# Patient Record
Sex: Female | Born: 2000 | Race: Black or African American | Hispanic: No | Marital: Single | Smoking: Never smoker
Health system: Southern US, Community
[De-identification: ages and names within clinical notes are randomized; demographics above are authoritative.]

## PROBLEM LIST (undated history)

## (undated) DIAGNOSIS — J45998 Other asthma: Secondary | ICD-10-CM

## (undated) DIAGNOSIS — J302 Other seasonal allergic rhinitis: Secondary | ICD-10-CM

## (undated) HISTORY — DX: Other asthma: J45.998

## (undated) HISTORY — DX: Other seasonal allergic rhinitis: J30.2

## (undated) HISTORY — PX: OTHER SURGICAL HISTORY: SHX169

---
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
## 2018-01-11 DIAGNOSIS — J4541 Moderate persistent asthma with (acute) exacerbation: Secondary | ICD-10-CM

## 2018-01-11 NOTE — ED Notes (Signed)
Pt getting breathing tx.

## 2018-01-11 NOTE — ED Triage Notes (Signed)
Pt states she has been feeling sob x 2 days and has been doing her treatments and is not getting pt having exp wheezing

## 2018-01-11 NOTE — ED Provider Notes (Addendum)
17 year old female with asthma presents with a 2 day exacerbation.  Yesterday she used her pro-air inhaler 2 puffs at a time 3-4 times without much improvement.  Today she used her nebulizer 4 times, with albuterol and added ipratropium to one treatment.    sHe still feels tight, has a mild dry cough which is nonproductive.  Patient denies fevers.    She has been on steroids before, but not recently.             Past Medical History:   Diagnosis Date   ??? Allergic rhinitis    ??? Aspergillosis (HCC)    ??? Asthma    ??? Asthma    ??? GERD (gastroesophageal reflux disease)    ??? Molluscum contagiosum        No past surgical history on file.      Family History:   Problem Relation Age of Onset   ??? Hypertension Other    ??? Diabetes Other    ??? Coronary Artery Disease Other    ??? Other Other         CEREBROVASCULAR ACCIDENT       Social History     Socioeconomic History   ??? Marital status: SINGLE     Spouse name: Not on file   ??? Number of children: Not on file   ??? Years of education: Not on file   ??? Highest education level: Not on file   Occupational History   ??? Not on file   Social Needs   ??? Financial resource strain: Not on file   ??? Food insecurity:     Worry: Not on file     Inability: Not on file   ??? Transportation needs:     Medical: Not on file     Non-medical: Not on file   Tobacco Use   ??? Smoking status: Never Smoker   Substance and Sexual Activity   ??? Alcohol use: No   ??? Drug use: No   ??? Sexual activity: Never   Lifestyle   ??? Physical activity:     Days per week: Not on file     Minutes per session: Not on file   ??? Stress: Not on file   Relationships   ??? Social connections:     Talks on phone: Not on file     Gets together: Not on file     Attends religious service: Not on file     Active member of club or organization: Not on file     Attends meetings of clubs or organizations: Not on file     Relationship status: Not on file   ??? Intimate partner violence:     Fear of current or ex partner: Not on file      Emotionally abused: Not on file     Physically abused: Not on file     Forced sexual activity: Not on file   Other Topics Concern   ??? Not on file   Social History Narrative   ??? Not on file         ALLERGIES: Fish containing products; Peanut oil; and Singulair [montelukast]    Review of Systems   Constitutional: Negative for chills and fever.   HENT: Negative for rhinorrhea and sore throat.    Eyes: Negative for discharge and redness.   Respiratory: Positive for cough, shortness of breath and wheezing. Negative for stridor.    Cardiovascular: Negative for chest pain and palpitations.   Gastrointestinal: Negative  for abdominal pain, nausea and vomiting.   Musculoskeletal: Negative for arthralgias and back pain.   Skin: Negative for rash.   Neurological: Negative for dizziness and headaches.   All other systems reviewed and are negative.      Vitals:    01/11/18 2242 01/11/18 2323   BP: 108/73    Pulse: 72    Resp: 20    Temp: 98.3 ??F (36.8 ??C)    SpO2: 100% 100%   Weight: 81.6 kg (180 lb)    Height: 5\' 7"  (1.702 m)             Physical Exam   Constitutional: She is oriented to person, place, and time. She appears well-developed and well-nourished. No distress.   HENT:   Head: Normocephalic and atraumatic.   Mouth/Throat: Oropharynx is clear and moist.   Eyes: Pupils are equal, round, and reactive to light. Conjunctivae and EOM are normal. Right eye exhibits no discharge. Left eye exhibits no discharge. No scleral icterus.   Neck: Normal range of motion. Neck supple.   Cardiovascular: Normal rate, regular rhythm and normal heart sounds. Exam reveals no gallop.   No murmur heard.  Pulmonary/Chest: Effort normal. No stridor. No respiratory distress. She has wheezes. She has no rales.   Musculoskeletal: Normal range of motion. She exhibits no edema.   Neurological: She is alert and oriented to person, place, and time. She exhibits normal muscle tone.   cni 2-12 grossly    Skin: Skin is warm and dry. She is not diaphoretic.   Psychiatric: She has a normal mood and affect. Her behavior is normal.   Nursing note and vitals reviewed.       MDM  Number of Diagnoses or Management Options  Moderate persistent asthma with acute exacerbation:   Diagnosis management comments: Medical decision making note:  Asthma attack, persistent  Give some IV magnesium and IV steroids.  Reassess after a 10 mg albuterol nebulizer.  If suitable for discharge we'll send home with some oral steroids for the next few days    12:13 AM  Feeling much better,, wheezing gone, sats remain 100%    This concludes the "medical decision making note" part of this emergency department visit note.           Procedures

## 2018-01-11 NOTE — ED Provider Notes (Signed)
17 year old female with asthma presents with a 2 day exacerbation.  Yesterday she used her pro-air inhaler 2 puffs at a time 3-4 times without much improvement.  Today she used her nebulizer 4 times, with albuterol and added ipratropium to one treatment.    sHe still feels tight, has a mild dry cough which is nonproductive.  Patient denies fevers.    She has been on steroids before, but not recently.             Past Medical History:   Diagnosis Date   ??? Allergic rhinitis    ??? Aspergillosis (HCC)    ??? Asthma    ??? Asthma    ??? GERD (gastroesophageal reflux disease)    ??? Molluscum contagiosum        No past surgical history on file.      Family History:   Problem Relation Age of Onset   ??? Hypertension Other    ??? Diabetes Other    ??? Coronary Artery Disease Other    ??? Other Other         CEREBROVASCULAR ACCIDENT       Social History     Socioeconomic History   ??? Marital status: SINGLE     Spouse name: Not on file   ??? Number of children: Not on file   ??? Years of education: Not on file   ??? Highest education level: Not on file   Occupational History   ??? Not on file   Social Needs   ??? Financial resource strain: Not on file   ??? Food insecurity:     Worry: Not on file     Inability: Not on file   ??? Transportation needs:     Medical: Not on file     Non-medical: Not on file   Tobacco Use   ??? Smoking status: Never Smoker   Substance and Sexual Activity   ??? Alcohol use: No   ??? Drug use: No   ??? Sexual activity: Never   Lifestyle   ??? Physical activity:     Days per week: Not on file     Minutes per session: Not on file   ??? Stress: Not on file   Relationships   ??? Social connections:     Talks on phone: Not on file     Gets together: Not on file     Attends religious service: Not on file     Active member of club or organization: Not on file     Attends meetings of clubs or organizations: Not on file     Relationship status: Not on file   ??? Intimate partner violence:     Fear of current or ex partner: Not on file     Emotionally  abused: Not on file     Physically abused: Not on file     Forced sexual activity: Not on file   Other Topics Concern   ??? Not on file   Social History Narrative   ??? Not on file         ALLERGIES: Fish containing products; Peanut oil; and Singulair [montelukast]    Review of Systems   Constitutional: Negative for chills and fever.   HENT: Negative for rhinorrhea and sore throat.    Eyes: Negative for discharge and redness.   Respiratory: Positive for cough, shortness of breath and wheezing. Negative for stridor.    Cardiovascular: Negative for chest pain and palpitations.   Gastrointestinal: Negative  for abdominal pain, nausea and vomiting.   Musculoskeletal: Negative for arthralgias and back pain.   Skin: Negative for rash.   Neurological: Negative for dizziness and headaches.   All other systems reviewed and are negative.      Vitals:    01/11/18 2242 01/11/18 2323   BP: 108/73    Pulse: 72    Resp: 20    Temp: 98.3 ??F (36.8 ??C)    SpO2: 100% 100%   Weight: 81.6 kg (180 lb)    Height: 5\' 7"  (1.702 m)             Physical Exam   Constitutional: She is oriented to person, place, and time. She appears well-developed and well-nourished. No distress.   HENT:   Head: Normocephalic and atraumatic.   Mouth/Throat: Oropharynx is clear and moist.   Eyes: Pupils are equal, round, and reactive to light. Conjunctivae and EOM are normal. Right eye exhibits no discharge. Left eye exhibits no discharge. No scleral icterus.   Neck: Normal range of motion. Neck supple.   Cardiovascular: Normal rate, regular rhythm and normal heart sounds. Exam reveals no gallop.   No murmur heard.  Pulmonary/Chest: Effort normal. No stridor. No respiratory distress. She has wheezes. She has no rales.   Musculoskeletal: Normal range of motion. She exhibits no edema.   Neurological: She is alert and oriented to person, place, and time. She exhibits normal muscle tone.   cni 2-12 grossly   Skin: Skin is warm and dry. She is not diaphoretic.    Psychiatric: She has a normal mood and affect. Her behavior is normal.   Nursing note and vitals reviewed.       MDM  Number of Diagnoses or Management Options  Moderate persistent asthma with acute exacerbation:   Diagnosis management comments: Medical decision making note:  Asthma attack, persistent  Give some IV magnesium and IV steroids.  Reassess after a 10 mg albuterol nebulizer.  If suitable for discharge we'll send home with some oral steroids for the next few days    12:13 AM  Feeling much better,, wheezing gone, sats remain 100%    This concludes the "medical decision making note" part of this emergency department visit note.           Procedures

## 2018-01-11 NOTE — ED Notes (Signed)
Pt states she has been feeling sob x 2 days and has been doing her treatments and is not getting pt having exp wheezing

## 2018-01-12 ENCOUNTER — Inpatient Hospital Stay
Admit: 2018-01-12 | Discharge: 2018-01-12 | Disposition: A | Discharge status: Home / Self Care | Payer: Self-pay | Attending: Emergency Medicine

## 2018-01-12 MED ORDER — MAGNESIUM SULFATE 2 GRAM/50 ML IVPB
2 gram/50 mL (4 %) | INTRAVENOUS | Status: AC
Start: 2018-01-12 — End: 2018-01-12
  Administered 2018-01-12: 03:00:00 via INTRAVENOUS

## 2018-01-12 MED ORDER — SODIUM CHLORIDE 0.9% BOLUS IV
0.9 % | Freq: Once | INTRAVENOUS | Status: AC
Start: 2018-01-12 — End: 2018-01-12
  Administered 2018-01-12: 03:00:00 via INTRAVENOUS

## 2018-01-12 MED ORDER — METHYLPREDNISOLONE (PF) 125 MG/2 ML IJ SOLR
125 mg/2 mL | Freq: Once | INTRAMUSCULAR | Status: AC
Start: 2018-01-12 — End: 2018-01-11
  Administered 2018-01-12: 03:00:00 via INTRAVENOUS

## 2018-01-12 MED ORDER — PREDNISONE 20 MG TAB
20 mg | ORAL_TABLET | Freq: Every day | ORAL | 0 refills | Status: AC
Start: 2018-01-12 — End: 2018-01-15

## 2018-01-12 MED ORDER — ALBUTEROL SULFATE 0.083 % (0.83 MG/ML) SOLN FOR INHALATION
2.5 mg /3 mL (0.083 %) | RESPIRATORY_TRACT | Status: AC
Start: 2018-01-12 — End: 2018-01-11
  Administered 2018-01-12: 03:00:00 via RESPIRATORY_TRACT

## 2018-01-12 MED FILL — SOLU-MEDROL (PF) 125 MG/2 ML SOLUTION FOR INJECTION: 125 mg/2 mL | INTRAMUSCULAR | Qty: 2

## 2018-01-12 MED FILL — MAGNESIUM SULFATE 2 GRAM/50 ML IVPB: 2 gram/50 mL (4 %) | INTRAVENOUS | Qty: 50

## 2018-01-12 MED FILL — ALBUTEROL SULFATE 0.083 % (0.83 MG/ML) SOLN FOR INHALATION: 2.5 mg /3 mL (0.083 %) | RESPIRATORY_TRACT | Qty: 4

## 2018-01-12 NOTE — ED Notes (Signed)
20 ga IV removed from  Right ac at time of discharge

## 2018-01-12 NOTE — ED Notes (Signed)
I have reviewed discharge instructions with the patient.  The patient verbalized understanding.    Patient left ED via Discharge Method: ambulatory to Home with self.    Opportunity for questions and clarification provided.       Patient given 1 scripts.         To continue your aftercare when you leave the hospital, you may receive an automated call from our care team to check in on how you are doing.  This is a free service and part of our promise to provide the best care and service to meet your aftercare needs.??? If you have questions, or wish to unsubscribe from this service please call [phone].  Thank you for Choosing our [place] [facility] Department.

## 2018-01-12 NOTE — ED Notes (Signed)
20 ga IV removed from  Right ac at time of discharge

## 2018-01-12 NOTE — ED Notes (Signed)
I have reviewed discharge instructions with the patient.  The patient verbalized understanding.    Patient left ED via Discharge Method: ambulatory to Home with self.    Opportunity for questions and clarification provided.       Patient given 1 scripts.         To continue your aftercare when you leave the hospital, you may receive an automated call from our care team to check in on how you are doing.  This is a free service and part of our promise to provide the best care and service to meet your aftercare needs." If you have questions, or wish to unsubscribe from this service please call [phone].  Thank you for Choosing our [place] Emergency Department.

## 2018-02-07 ENCOUNTER — Inpatient Hospital Stay
Admit: 2018-02-07 | Discharge: 2018-02-07 | Disposition: A | Discharge status: Home / Self Care | Payer: Self-pay | Attending: Emergency Medicine

## 2018-02-07 DIAGNOSIS — T7849XA Other allergy, initial encounter: Secondary | ICD-10-CM

## 2018-02-07 MED ORDER — HYDROXYZINE PAMOATE 25 MG CAP
25 mg | ORAL | Status: AC
Start: 2018-02-07 — End: 2018-02-07
  Administered 2018-02-07: 18:00:00 via ORAL

## 2018-02-07 MED ORDER — HYDROXYZINE PAMOATE 25 MG CAP
25 mg | ORAL_CAPSULE | Freq: Four times a day (QID) | ORAL | 0 refills | Status: AC | PRN
Start: 2018-02-07 — End: 2018-02-14

## 2018-02-07 MED ORDER — FAMOTIDINE 20 MG TAB
20 mg | ORAL | Status: AC
Start: 2018-02-07 — End: 2018-02-07
  Administered 2018-02-07: 18:00:00 via ORAL

## 2018-02-07 MED ORDER — FAMOTIDINE 20 MG TAB
20 mg | ORAL_TABLET | Freq: Two times a day (BID) | ORAL | 0 refills | Status: AC
Start: 2018-02-07 — End: 2018-02-17

## 2018-02-07 MED ORDER — PREDNISONE 20 MG TAB
20 mg | ORAL_TABLET | Freq: Every day | ORAL | 0 refills | Status: AC
Start: 2018-02-07 — End: 2018-02-12

## 2018-02-07 MED ORDER — PREDNISONE 10 MG TAB
10 mg | ORAL | Status: AC
Start: 2018-02-07 — End: 2018-02-07
  Administered 2018-02-07: 18:00:00 via ORAL

## 2018-02-07 MED FILL — FAMOTIDINE 20 MG TAB: 20 mg | ORAL | Qty: 2

## 2018-02-07 MED FILL — PREDNISONE 10 MG TAB: 10 mg | ORAL | Qty: 1

## 2018-02-07 MED FILL — HYDROXYZINE PAMOATE 25 MG CAP: 25 mg | ORAL | Qty: 1

## 2018-02-07 NOTE — ED Triage Notes (Signed)
Pt has a rash around lips for a few days that is itching. States she has had this before when changing lip balms. States she has been tested for herpes and that was negative.

## 2018-02-07 NOTE — ED Notes (Signed)
I have reviewed discharge instructions with the patient and parent.  The patient and parent verbalized understanding.    Patient left ED via Discharge Method: wheelchair to Home with mother.    Opportunity for questions and clarification provided.       Patient given 3 scripts. Prednisone, vistaril and pepcid. Instructed to start steroids tomorrow at lunch. Work excuse given for mother and patient as requested.        To continue your aftercare when you leave the hospital, you may receive an automated call from our care team to check in on how you are doing.  This is a free service and part of our promise to provide the best care and service to meet your aftercare needs.??? If you have questions, or wish to unsubscribe from this service please call [phone].  Thank you for Choosing our [facility]  Emergency Department.

## 2018-02-07 NOTE — Progress Notes (Signed)
Visited with patient as no PCP listed in chart.  Patient states no PCP and no insurance.  Explained the [employer] program in detail and provided patient with resources. Contact information for Sherry with [employer] given to patient and patient encouraged to follow up with her as soon as possible. Patient also given contact information for Jeremy with [employer] and encouraged to call to get screened for Medicaid/Insurance eligibility and/or financial assistance.     Tina LeAnn Chastain, BSW  Social Work Case Manager  [facility]  [email or web address]

## 2018-02-07 NOTE — ED Provider Notes (Signed)
17 year old female with a past medical history significant for asthma  Complains of itching rash on her lower face and perioral area    Patient's only new exposure is a change in her lip gloss  Similar reaction in the past when she used some cinnamon flavored lip gloss    The history is provided by the patient and a parent.   Rash    This is a new problem. The current episode started yesterday. The problem has been gradually worsening. The problem is associated with new makeup. There has been no fever. The rash is present on the face and lips. The pain is mild. The pain has been constant since onset. Associated symptoms include itching. Pertinent negatives include no blisters. She has tried nothing for the symptoms. The treatment provided no relief.        Past Medical History:   Diagnosis Date   ??? Allergic rhinitis    ??? Aspergillosis (HCC)    ??? Asthma    ??? Asthma    ??? GERD (gastroesophageal reflux disease)    ??? Molluscum contagiosum        History reviewed. No pertinent surgical history.      Family History:   Problem Relation Age of Onset   ??? Hypertension Other    ??? Diabetes Other    ??? Coronary Artery Disease Other    ??? Other Other         CEREBROVASCULAR ACCIDENT       Social History     Socioeconomic History   ??? Marital status: SINGLE     Spouse name: Not on file   ??? Number of children: Not on file   ??? Years of education: Not on file   ??? Highest education level: Not on file   Occupational History   ??? Not on file   Social Needs   ??? Financial resource strain: Not on file   ??? Food insecurity:     Worry: Not on file     Inability: Not on file   ??? Transportation needs:     Medical: Not on file     Non-medical: Not on file   Tobacco Use   ??? Smoking status: Never Smoker   Substance and Sexual Activity   ??? Alcohol use: No   ??? Drug use: No   ??? Sexual activity: Never   Lifestyle   ??? Physical activity:     Days per week: Not on file     Minutes per session: Not on file   ??? Stress: Not on file   Relationships    ??? Social connections:     Talks on phone: Not on file     Gets together: Not on file     Attends religious service: Not on file     Active member of club or organization: Not on file     Attends meetings of clubs or organizations: Not on file     Relationship status: Not on file   ??? Intimate partner violence:     Fear of current or ex partner: Not on file     Emotionally abused: Not on file     Physically abused: Not on file     Forced sexual activity: Not on file   Other Topics Concern   ??? Not on file   Social History Narrative   ??? Not on file         ALLERGIES: Fish containing products; Peanut oil; and Singulair [montelukast]  Review of Systems   Constitutional: Negative for chills and fever.   HENT: Negative for congestion, ear pain and rhinorrhea.    Eyes: Negative for photophobia and discharge.   Respiratory: Negative for cough and shortness of breath.    Cardiovascular: Negative for chest pain and palpitations.   Gastrointestinal: Negative for abdominal pain, constipation, diarrhea and vomiting.   Endocrine: Negative for cold intolerance and heat intolerance.   Genitourinary: Negative for dysuria and flank pain.   Musculoskeletal: Negative for arthralgias, myalgias and neck pain.   Skin: Positive for itching and rash. Negative for wound.   Allergic/Immunologic: Negative for environmental allergies and food allergies.   Neurological: Negative for syncope and headaches.   Hematological: Negative for adenopathy. Does not bruise/bleed easily.   Psychiatric/Behavioral: Negative for dysphoric mood. The patient is not nervous/anxious.    All other systems reviewed and are negative.      Vitals:    02/07/18 1326   BP: 116/76   Pulse: 94   Resp: 20   Temp: 99 ??F (37.2 ??C)   SpO2: 100%   Weight: 95.3 kg (210 lb)   Height: 5\' 8"  (1.727 m)            Physical Exam   Constitutional: She is oriented to person, place, and time. She appears well-developed and well-nourished. She appears distressed.   HENT:    Head: Normocephalic and atraumatic.   Mouth/Throat: Oropharynx is clear and moist. No oropharyngeal exudate.   Eyes: Pupils are equal, round, and reactive to light. Conjunctivae and EOM are normal.   Neck: Normal range of motion. Neck supple. No JVD present.   Cardiovascular: Normal rate, regular rhythm, normal heart sounds and intact distal pulses. Exam reveals no gallop and no friction rub.   No murmur heard.  Pulmonary/Chest: Effort normal and breath sounds normal.   Abdominal: Soft. Normal appearance and bowel sounds are normal. She exhibits no distension and no mass. There is no hepatosplenomegaly. There is no tenderness.   Musculoskeletal: Normal range of motion. She exhibits no edema or deformity.   Neurological: She is alert and oriented to person, place, and time. She has normal strength. No cranial nerve deficit or sensory deficit. She displays a negative Romberg sign. Gait normal.   Skin: Skin is warm and dry. Capillary refill takes less than 2 seconds. Rash noted.   Papular rash noted in the perioral region as well as extending along both jaw lines into the cheeks  No open wounds  No pustules  No swelling or erythema noted   Psychiatric: She has a normal mood and affect. Her speech is normal and behavior is normal. Judgment and thought content normal. Cognition and memory are normal.   Nursing note and vitals reviewed.       MDM  Number of Diagnoses or Management Options  Allergic reaction, initial encounter: new and does not require workup  Diagnosis management comments: Symptoms appear related to a allergic reaction.  We will treat symptomatically with steroids, histamine blockade           Amount and/or Complexity of Data Reviewed  Tests in the medicine section of CPT??: ordered and reviewed  Review and summarize past medical records: yes    Risk of Complications, Morbidity, and/or Mortality  Presenting problems: moderate  Diagnostic procedures: minimal  Management options: low   General comments: Elements of this note have been dictated via voice recognition software.  Text and phrases may be limited by the accuracy of the software.  The chart has been reviewed, but errors may still be present.      Patient Progress  Patient progress: stable         Procedures

## 2018-02-07 NOTE — ED Notes (Signed)
 I have reviewed discharge instructions with the patient and parent.  The patient and parent verbalized understanding.    Patient left ED via Discharge Method: wheelchair to Home with mother.    Opportunity for questions and clarification provided.       Patient given 3 scripts. Prednisone , vistaril and pepcid. Instructed to start steroids tomorrow at lunch. Work excuse given for mother and patient as requested.        To continue your aftercare when you leave the hospital, you may receive an automated call from our care team to check in on how you are doing.  This is a free service and part of our promise to provide the best care and service to meet your aftercare needs." If you have questions, or wish to unsubscribe from this service please call [phone].  Thank you for Choosing our [facility] Emergency Department.

## 2018-02-07 NOTE — ED Provider Notes (Signed)
17 year old female with a past medical history significant for asthma  Complains of itching rash on her lower face and perioral area    Patient's only new exposure is a change in her lip gloss  Similar reaction in the past when she used some cinnamon flavored lip gloss    The history is provided by the patient and a parent.   Rash    This is a new problem. The current episode started yesterday. The problem has been gradually worsening. The problem is associated with new makeup. There has been no fever. The rash is present on the face and lips. The pain is mild. The pain has been constant since onset. Associated symptoms include itching. Pertinent negatives include no blisters. She has tried nothing for the symptoms. The treatment provided no relief.        Past Medical History:   Diagnosis Date   ??? Allergic rhinitis    ??? Aspergillosis (HCC)    ??? Asthma    ??? Asthma    ??? GERD (gastroesophageal reflux disease)    ??? Molluscum contagiosum        History reviewed. No pertinent surgical history.      Family History:   Problem Relation Age of Onset   ??? Hypertension Other    ??? Diabetes Other    ??? Coronary Artery Disease Other    ??? Other Other         CEREBROVASCULAR ACCIDENT       Social History     Socioeconomic History   ??? Marital status: SINGLE     Spouse name: Not on file   ??? Number of children: Not on file   ??? Years of education: Not on file   ??? Highest education level: Not on file   Occupational History   ??? Not on file   Social Needs   ??? Financial resource strain: Not on file   ??? Food insecurity:     Worry: Not on file     Inability: Not on file   ??? Transportation needs:     Medical: Not on file     Non-medical: Not on file   Tobacco Use   ??? Smoking status: Never Smoker   Substance and Sexual Activity   ??? Alcohol use: No   ??? Drug use: No   ??? Sexual activity: Never   Lifestyle   ??? Physical activity:     Days per week: Not on file     Minutes per session: Not on file   ??? Stress: Not on file   Relationships   ??? Social  connections:     Talks on phone: Not on file     Gets together: Not on file     Attends religious service: Not on file     Active member of club or organization: Not on file     Attends meetings of clubs or organizations: Not on file     Relationship status: Not on file   ??? Intimate partner violence:     Fear of current or ex partner: Not on file     Emotionally abused: Not on file     Physically abused: Not on file     Forced sexual activity: Not on file   Other Topics Concern   ??? Not on file   Social History Narrative   ??? Not on file         ALLERGIES: Fish containing products; Peanut oil; and Singulair [montelukast]  Review of Systems   Constitutional: Negative for chills and fever.   HENT: Negative for congestion, ear pain and rhinorrhea.    Eyes: Negative for photophobia and discharge.   Respiratory: Negative for cough and shortness of breath.    Cardiovascular: Negative for chest pain and palpitations.   Gastrointestinal: Negative for abdominal pain, constipation, diarrhea and vomiting.   Endocrine: Negative for cold intolerance and heat intolerance.   Genitourinary: Negative for dysuria and flank pain.   Musculoskeletal: Negative for arthralgias, myalgias and neck pain.   Skin: Positive for itching and rash. Negative for wound.   Allergic/Immunologic: Negative for environmental allergies and food allergies.   Neurological: Negative for syncope and headaches.   Hematological: Negative for adenopathy. Does not bruise/bleed easily.   Psychiatric/Behavioral: Negative for dysphoric mood. The patient is not nervous/anxious.    All other systems reviewed and are negative.      Vitals:    02/07/18 1326   BP: 116/76   Pulse: 94   Resp: 20   Temp: 99 ??F (37.2 ??C)   SpO2: 100%   Weight: 95.3 kg (210 lb)   Height: 5\' 8"  (1.727 m)            Physical Exam   Constitutional: She is oriented to person, place, and time. She appears well-developed and well-nourished. She appears distressed.   HENT:   Head: Normocephalic and  atraumatic.   Mouth/Throat: Oropharynx is clear and moist. No oropharyngeal exudate.   Eyes: Pupils are equal, round, and reactive to light. Conjunctivae and EOM are normal.   Neck: Normal range of motion. Neck supple. No JVD present.   Cardiovascular: Normal rate, regular rhythm, normal heart sounds and intact distal pulses. Exam reveals no gallop and no friction rub.   No murmur heard.  Pulmonary/Chest: Effort normal and breath sounds normal.   Abdominal: Soft. Normal appearance and bowel sounds are normal. She exhibits no distension and no mass. There is no hepatosplenomegaly. There is no tenderness.   Musculoskeletal: Normal range of motion. She exhibits no edema or deformity.   Neurological: She is alert and oriented to person, place, and time. She has normal strength. No cranial nerve deficit or sensory deficit. She displays a negative Romberg sign. Gait normal.   Skin: Skin is warm and dry. Capillary refill takes less than 2 seconds. Rash noted.   Papular rash noted in the perioral region as well as extending along both jaw lines into the cheeks  No open wounds  No pustules  No swelling or erythema noted   Psychiatric: She has a normal mood and affect. Her speech is normal and behavior is normal. Judgment and thought content normal. Cognition and memory are normal.   Nursing note and vitals reviewed.       MDM  Number of Diagnoses or Management Options  Allergic reaction, initial encounter: new and does not require workup  Diagnosis management comments: Symptoms appear related to a allergic reaction.  We will treat symptomatically with steroids, histamine blockade           Amount and/or Complexity of Data Reviewed  Tests in the medicine section of CPT??: ordered and reviewed  Review and summarize past medical records: yes    Risk of Complications, Morbidity, and/or Mortality  Presenting problems: moderate  Diagnostic procedures: minimal  Management options: low  General comments: Elements of this note have  been dictated via voice recognition software.  Text and phrases may be limited by the accuracy of the software.  The chart has been reviewed, but errors may still be present.      Patient Progress  Patient progress: stable         Procedures

## 2018-02-07 NOTE — ED Notes (Signed)
Pt has a rash around lips for a few days that is itching. States she has had this before when changing lip balms. States she has been tested for herpes and that was negative.

## 2018-03-03 ENCOUNTER — Inpatient Hospital Stay
Admit: 2018-03-03 | Discharge: 2018-03-03 | Disposition: A | Discharge status: Home / Self Care | Payer: Self-pay | Attending: Emergency Medicine

## 2018-03-03 DIAGNOSIS — K120 Recurrent oral aphthae: Secondary | ICD-10-CM

## 2018-03-03 MED ORDER — ACYCLOVIR 400 MG TAB
400 mg | ORAL_TABLET | Freq: Three times a day (TID) | ORAL | 0 refills | Status: AC
Start: 2018-03-03 — End: 2018-03-13

## 2018-03-03 NOTE — ED Provider Notes (Signed)
HPI:  17-year-old female here with lesion on her upper lips.  Was seen here 3 weeks ago for similar symptoms.  At that time the concern was possible allergic reaction.  Took steroid  And hydroxyzine with some improvement.   Denies any tongue swelling.  No itchiness.  Both she and her boyfriend went to the health clinic and has been tested.  No STD.    ROS  Constitutional: No fever, no chills  Skin: no rash  Eye: No vision changes  ENMT: No sore throat  Respiratory: No shortness of breath, no cough  Cardiovascular: No chest pain, no palpitations  Gastrointestinal: No vomiting, no nausea, no diarrhea, no abdominal pain  GU: No dysuria  MSK: No back pain, no muscle pain, no joint pain  Neuro: No headache, no change in mental status, no numbness, no tingling, no weakness  Psych:   Endocrine:   All other review of systems positive per history of present illness and the above otherwise negative or noncontributory.    Visit Vitals  BP 124/74   Pulse 99   Temp 98.8 ??F (37.1 ??C)   Resp 16   SpO2 99%     Past Medical History:   Diagnosis Date   ??? Allergic rhinitis    ??? Aspergillosis (HCC)    ??? Asthma    ??? Asthma    ??? GERD (gastroesophageal reflux disease)    ??? Molluscum contagiosum      History reviewed. No pertinent surgical history.  Prior to Admission Medications   Prescriptions Last Dose Informant Patient Reported? Taking?   Budesonide-Formoterol (SYMBICORT) 80-4.5 mcg/Actuation HFAA inhaler   Yes No   Sig: Take  by inhalation.   ZYRTEC 10 mg Chew   Yes No   Sig: take by mouth.    budesonide (PULMICORT) 0.5 mg/2 mL nbsp   Yes No   Sig: 500 mcg by Nebulization route.   fluticasone (FLONASE) 50 mcg/Actuation nasal spray   Yes No   Sig: 2 Sprays by Nasal route nightly.   levalbuterol (XOPENEX) 0.31 mg/3 mL nebulization   Yes No   Sig: 0.31 mg by Nebulization route every four (4) hours as needed.   montelukast (SINGULAIR) 10 mg tablet   Yes No   Sig: Take 10 mg by mouth daily.      Facility-Administered Medications: None          Adult Exam   General: alert, no acute distress  Head: normocephalic, atraumatic  ENT: moist mucous membranes  Few ulcerated lesion noted in upper lip.  No face involvement.  No intraoral lesion noted.  Tongue is normal.  No lower lips involvement  Neck: supple, non-tender; full range of motion  Cardiovascular:normal peripheral perfusion, no edema  Respiratory:  normal respirations; no wheezing, rales or rhonchi  Gastrointestinal:   Back: Full range of motion  Musculoskeletal: no gross deformities  Neurological: no gross focal deficits; normal speech  Psychiatric: cooperative; appropriate mood and affect    MDM:  No other rash really good.  Cold sore at this ulcer.  Recommend acyclovir 400 mg 3 times daily x10 days.  Otherwise nontoxic, well-appearing.  Stable for discharge.  No signs of airway compromise.    Dragon voice recognition software was used to create this note. Although the note has been reviewed and corrected where necessary, additional errors may have been overlooked and remain in the text.

## 2018-03-03 NOTE — ED Notes (Signed)
I have reviewed discharge instructions with the patient.  The patient verbalized understanding.    Patient left ED via Discharge Method: ambulatory to Home with self    Opportunity for questions and clarification provided.       Patient given 1 scripts.   Medication explain to pt and pt v\\u to medication. Pt in no acute distress      To continue your aftercare when you leave the hospital, you may receive an automated call from our care team to check in on how you are doing.  This is a free service and part of our promise to provide the best care and service to meet your aftercare needs.??? If you have questions, or wish to unsubscribe from this service please call [phone].  Thank you for Choosing our [facility].

## 2018-03-03 NOTE — ED Triage Notes (Signed)
Pt has an allergic reaction to lip balm 2 weeks ago.  She was seen here and given antihistamines and prednisone which cleared up the reaction.  Today her lips broke out again.  Denies any new lip balm.

## 2018-03-03 NOTE — ED Notes (Signed)
 I have reviewed discharge instructions with the patient.  The patient verbalized understanding.    Patient left ED via Discharge Method: ambulatory to Home with self    Opportunity for questions and clarification provided.       Patient given 1 scripts.   Medication explain to pt and pt v\u to medication. Pt in no acute distress      To continue your aftercare when you leave the hospital, you may receive an automated call from our care team to check in on how you are doing.  This is a free service and part of our promise to provide the best care and service to meet your aftercare needs." If you have questions, or wish to unsubscribe from this service please call [phone].  Thank you for Choosing our [facility].

## 2018-03-03 NOTE — ED Provider Notes (Signed)
HPI:  17 year old female here with lesion on her upper lips.  Was seen here 3 weeks ago for similar symptoms.  At that time the concern was possible allergic reaction.  Took steroid  And hydroxyzine with some improvement.   Denies any tongue swelling.  No itchiness.  Both she and her boyfriend went to the health clinic and has been tested.  No STD.    ROS  Constitutional: No fever, no chills  Skin: no rash  Eye: No vision changes  ENMT: No sore throat  Respiratory: No shortness of breath, no cough  Cardiovascular: No chest pain, no palpitations  Gastrointestinal: No vomiting, no nausea, no diarrhea, no abdominal pain  GU: No dysuria  MSK: No back pain, no muscle pain, no joint pain  Neuro: No headache, no change in mental status, no numbness, no tingling, no weakness  Psych:   Endocrine:   All other review of systems positive per history of present illness and the above otherwise negative or noncontributory.    Visit Vitals  BP 124/74   Pulse 99   Temp 98.8 ??F (37.1 ??C)   Resp 16   SpO2 99%     Past Medical History:   Diagnosis Date   ??? Allergic rhinitis    ??? Aspergillosis (HCC)    ??? Asthma    ??? Asthma    ??? GERD (gastroesophageal reflux disease)    ??? Molluscum contagiosum      History reviewed. No pertinent surgical history.  Prior to Admission Medications   Prescriptions Last Dose Informant Patient Reported? Taking?   Budesonide-Formoterol (SYMBICORT) 80-4.5 mcg/Actuation HFAA inhaler   Yes No   Sig: Take  by inhalation.   ZYRTEC 10 mg Chew   Yes No   Sig: take by mouth.    budesonide (PULMICORT) 0.5 mg/2 mL nbsp   Yes No   Sig: 500 mcg by Nebulization route.   fluticasone (FLONASE) 50 mcg/Actuation nasal spray   Yes No   Sig: 2 Sprays by Nasal route nightly.   levalbuterol (XOPENEX) 0.31 mg/3 mL nebulization   Yes No   Sig: 0.31 mg by Nebulization route every four (4) hours as needed.   montelukast (SINGULAIR) 10 mg tablet   Yes No   Sig: Take 10 mg by mouth daily.      Facility-Administered Medications: None          Adult Exam   General: alert, no acute distress  Head: normocephalic, atraumatic  ENT: moist mucous membranes  Few ulcerated lesion noted in upper lip.  No face involvement.  No intraoral lesion noted.  Tongue is normal.  No lower lips involvement  Neck: supple, non-tender; full range of motion  Cardiovascular:normal peripheral perfusion, no edema  Respiratory:  normal respirations; no wheezing, rales or rhonchi  Gastrointestinal:   Back: Full range of motion  Musculoskeletal: no gross deformities  Neurological: no gross focal deficits; normal speech  Psychiatric: cooperative; appropriate mood and affect    MDM:  No other rash really good.  Cold sore at this ulcer.  Recommend acyclovir 400 mg 3 times daily x10 days.  Otherwise nontoxic, well-appearing.  Stable for discharge.  No signs of airway compromise.    Dragon voice recognition software was used to create this note. Although the note has been reviewed and corrected where necessary, additional errors may have been overlooked and remain in the text.

## 2018-03-03 NOTE — ED Notes (Signed)
Pt has an allergic reaction to lip balm 2 weeks ago.  She was seen here and given antihistamines and prednisone which cleared up the reaction.  Today her lips broke out again.  Denies any new lip balm.

## 2018-08-29 IMAGING — DX DG CHEST 2V
2 series · 2 of 2 positions shown · non-contrast
Comparison: None.

CLINICAL DATA: Cough, congestion, sob, wheezing x 1 wk, hx of
asthma.

EXAM:
CHEST  2 VIEW

[chest pa]
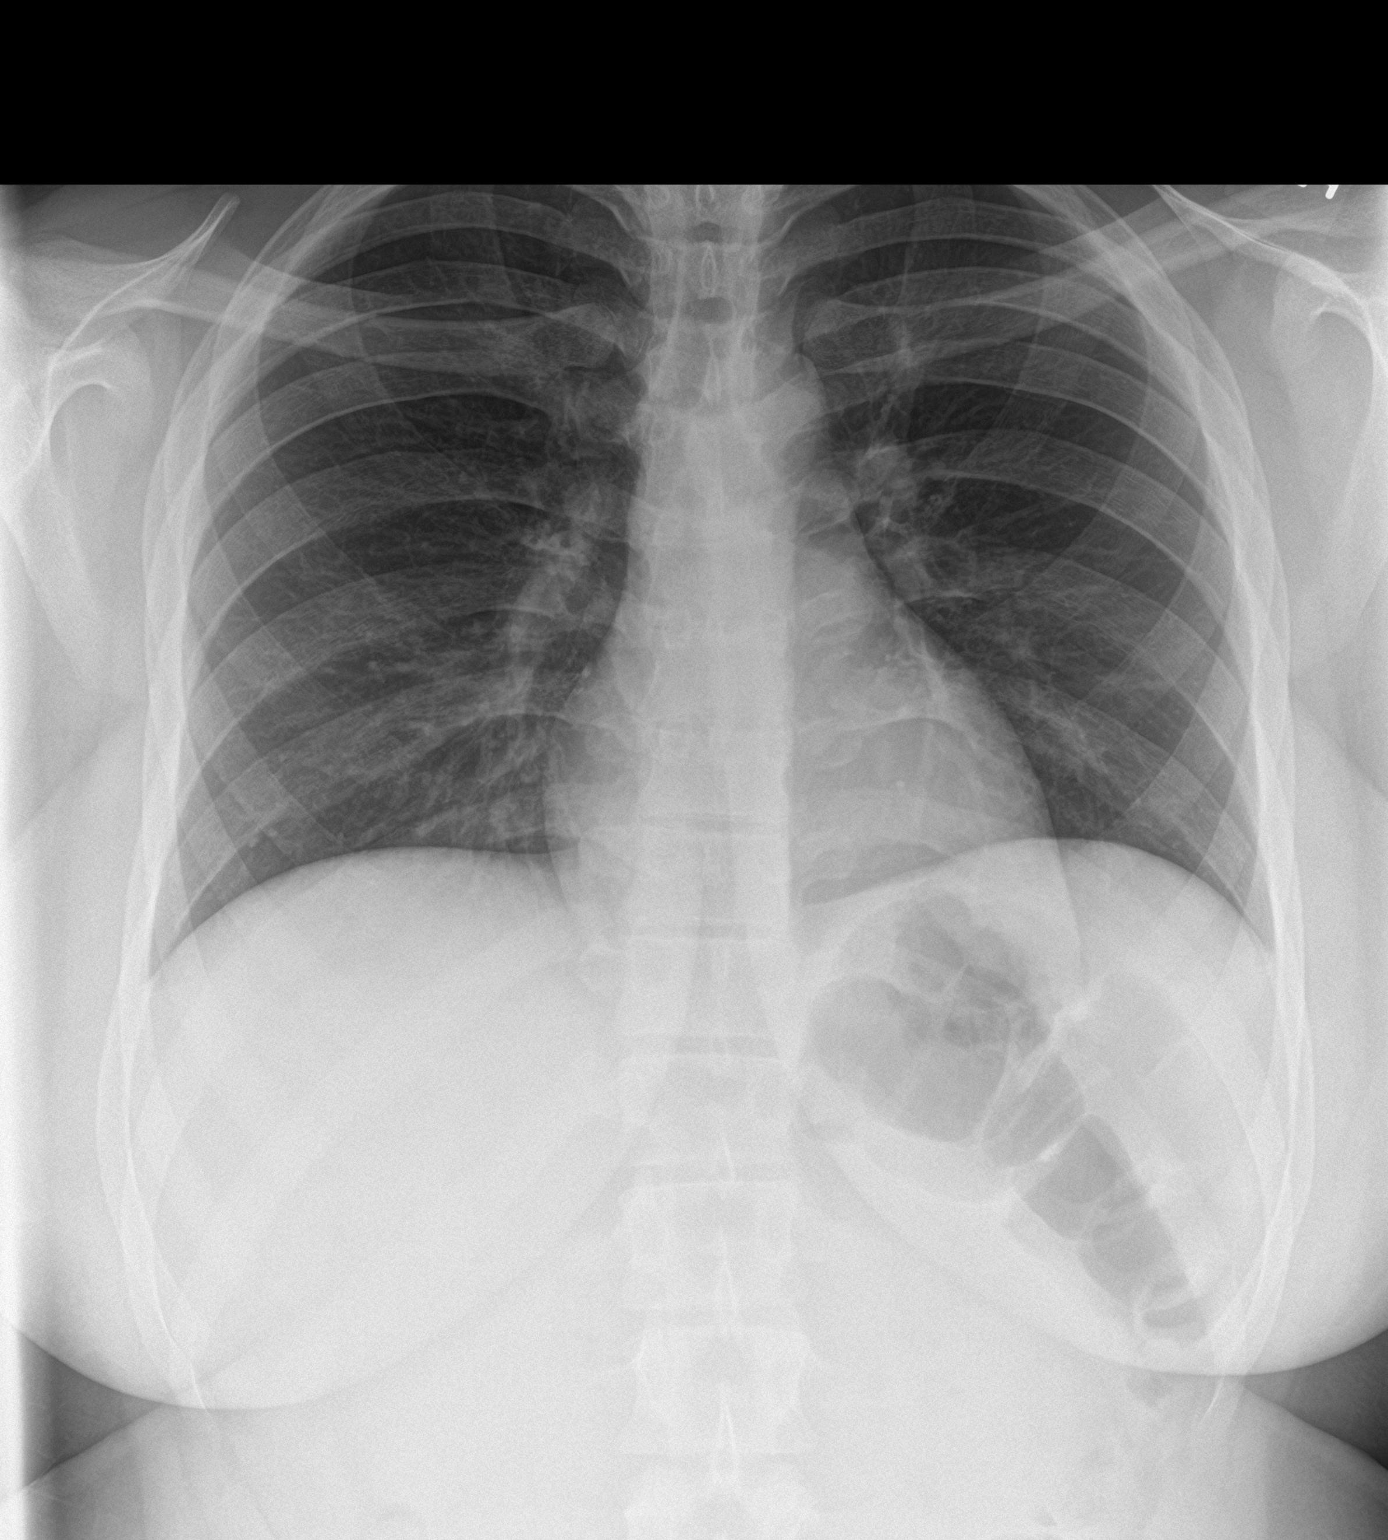

[chest lat]
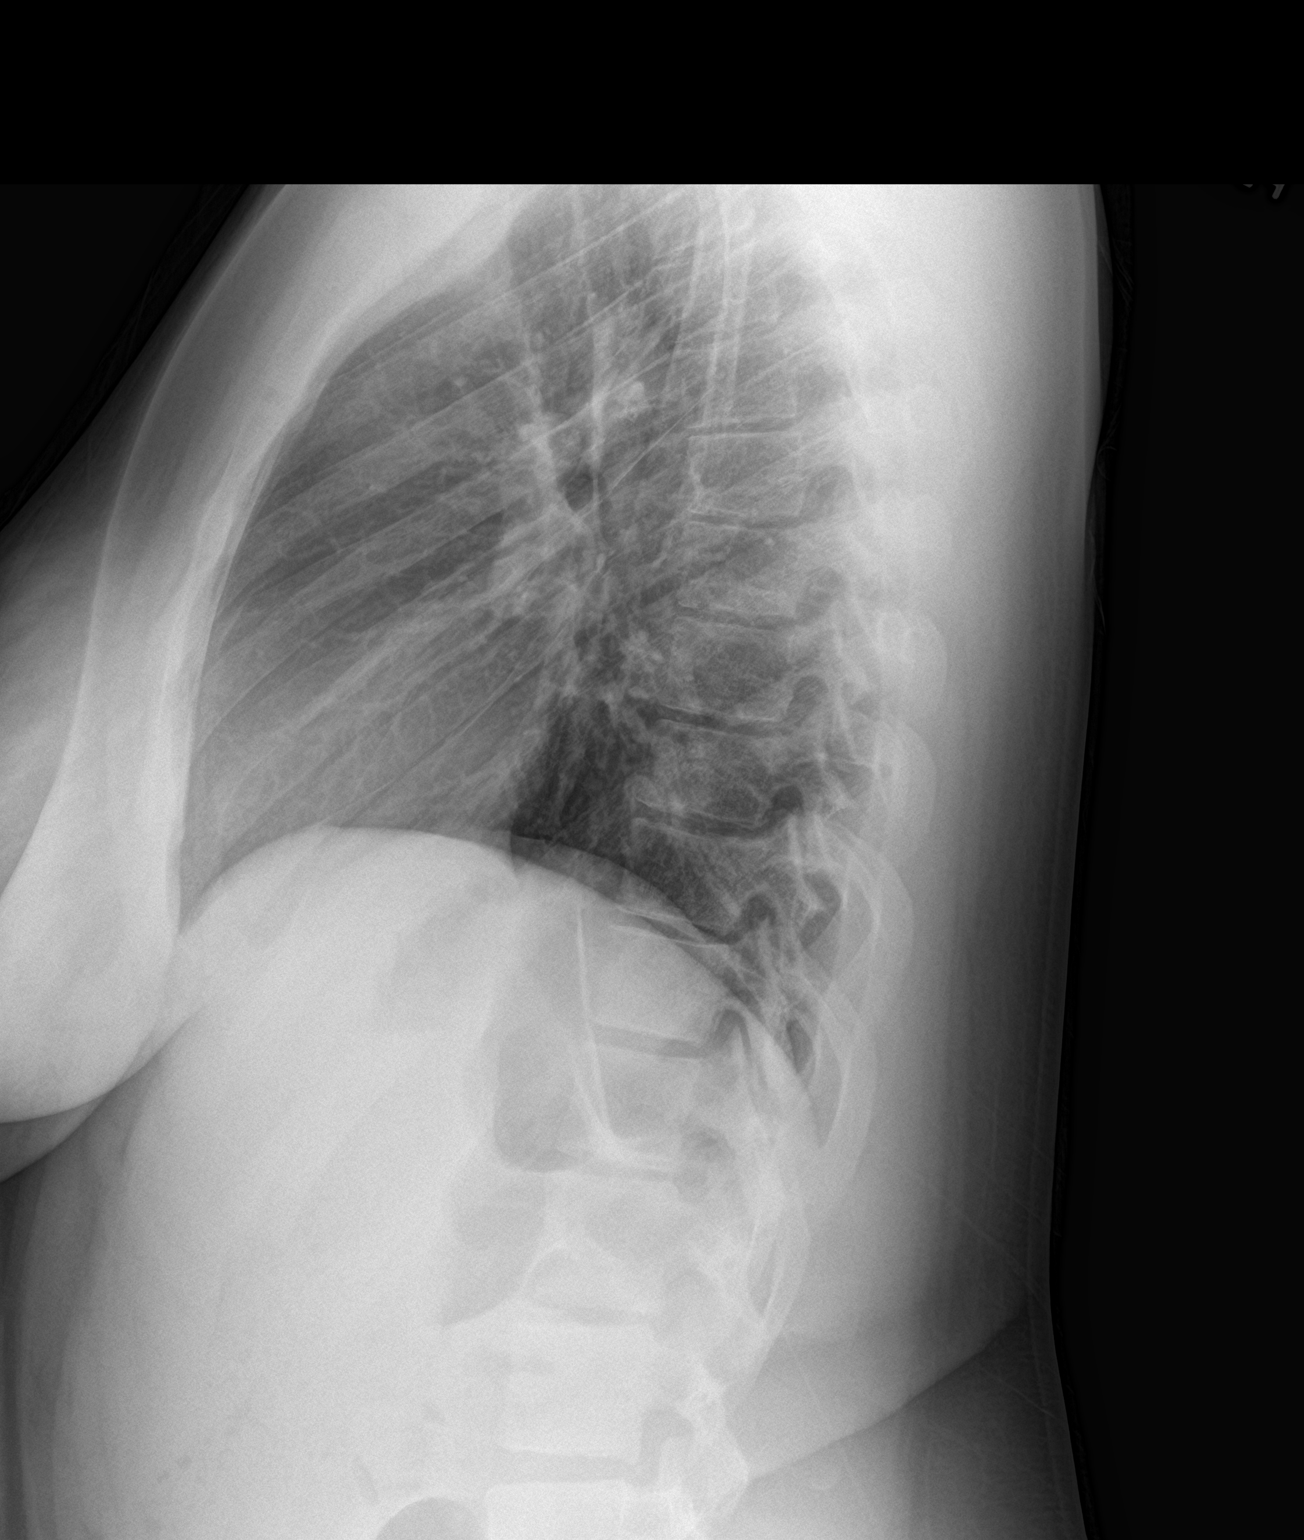

[2 of 2 positions shown; findings below may reference images not displayed]

FINDINGS: The heart size and mediastinal contours are within normal limits.
Both lungs are clear. The visualized skeletal structures are
unremarkable.
IMPRESSION: No active cardiopulmonary disease. No evidence of pneumonia or
pulmonary edema.

## 2020-01-20 ENCOUNTER — Inpatient Hospital Stay
Admit: 2020-01-20 | Discharge: 2020-01-20 | Disposition: A | Discharge status: Home / Self Care | Attending: Emergency Medicine

## 2020-01-20 DIAGNOSIS — N760 Acute vaginitis: Secondary | ICD-10-CM

## 2020-01-20 LAB — URINE MICROSCOPIC
BACTERIA, URINE: 0 /hpf
Bacteria: 0 /hpf

## 2020-01-20 LAB — WET PREP
Wet Prep: 5
Wet Prep: NONE SEEN
Wet Prep: NONE SEEN
Wet prep: 5
Wet prep: NONE SEEN
Wet prep: NONE SEEN

## 2020-01-20 LAB — HCG URINE, QL. - POC
HCG, Pregnancy, Urine, POC: NEGATIVE
Pregnancy test,urine (POC): NEGATIVE

## 2020-01-20 MED ORDER — METRONIDAZOLE 500 MG TAB
500 mg | ORAL_TABLET | Freq: Two times a day (BID) | ORAL | 0 refills | Status: AC
Start: 2020-01-20 — End: 2020-01-27

## 2020-01-20 MED ORDER — DOXYCYCLINE HYCLATE 100 MG TAB
100 mg | ORAL_TABLET | Freq: Two times a day (BID) | ORAL | 0 refills | Status: AC
Start: 2020-01-20 — End: 2020-01-27

## 2020-01-20 MED ORDER — DOXYCYCLINE HYCLATE 100 MG CAP
100 mg | ORAL | Status: AC
Start: 2020-01-20 — End: 2020-01-20
  Administered 2020-01-20: 14:00:00 via ORAL

## 2020-01-20 MED ORDER — LIDOCAINE HCL 1 % (10 MG/ML) IJ SOLN
10 mg/mL (1 %) | Freq: Once | INTRAMUSCULAR | Status: AC
Start: 2020-01-20 — End: 2020-01-20
  Administered 2020-01-20: 14:00:00 via INTRAMUSCULAR

## 2020-01-20 MED FILL — DOXYCYCLINE HYCLATE 100 MG CAP: 100 mg | ORAL | Qty: 1

## 2020-01-20 MED FILL — CEFTRIAXONE 500 MG SOLUTION FOR INJECTION: 500 mg | INTRAMUSCULAR | Qty: 500

## 2020-01-20 NOTE — ED Notes (Signed)
I have reviewed discharge instructions with the patient.  The patient verbalized understanding.    Patient left ED via Discharge Method: ambulatory to Home with self.    Opportunity for questions and clarification provided.       Patient given 2 scripts.         To continue your aftercare when you leave the hospital, you may receive an automated call from our care team to check in on how you are doing.  This is a free service and part of our promise to provide the best care and service to meet your aftercare needs." If you have questions, or wish to unsubscribe from this service please call [phone].  Thank you for Choosing our [facility].

## 2020-01-20 NOTE — ED Provider Notes (Signed)
ED Provider Notes by Dolly RiasKelly, Tannor Pyon L, PA at 01/20/20 [id number]                Author: Dolly RiasKelly, Sophiarose Eades L, PA  Service: Emergency Medicine  Author Type: Physician Assistant       Filed: 01/20/20 0934  Date of Service: 01/20/20 0928  Status: Attested           Editor: Dolly RiasKelly, Lyric Hoar L, PA (Physician Assistant)  Cosigner: Natale MilchHorn, Kevin D, DO at 01/20/20 1605          Attestation signed by Natale MilchHorn, Kevin D, DO at 01/20/20 1605          Attending Note - ATTENDING ATTESTATION:   Patient independently seen by Midlevel provider.  I did not personally see the patient.  I was available for discussion and evaluation but NO discussion about the patient was taken on this encounter.    _                                    19 year old female patient presents to emergency department with complaint of states she has vaginal discharge  starting at 10pm last night. She states the discharge is thin and "grayish". States her last period was July 11th. Pt denies itching or pain, but states everything does feel irritated.  Patient also states that she was 3 days ago just had a normal vaginal  exam done by her OB/GYN.      The history is provided by the patient.    Vaginal Discharge    This is a new problem. The  current episode started yesterday. The problem occurs  constantly. The problem has not changed since onset.The discharge occurs spontaneously. The discharge was grey. She is not pregnant. She has not missed her period. Pertinent  negatives include no anorexia, no diaphoresis, no fever, no abdominal swelling, no abdominal pain, no constipation, no diarrhea, no nausea, no vomiting, no dyspareunia, no dysuria, no frequency, no genital burning, no genital itching, no genital lesions,  no perineal pain, no perineal odor and no painful intercourse. Associated  symptoms comments: She states that her vagina feels irritated. She has tried nothing for the symptoms. The treatment provided no relief. Her past medical history does not  include irregular periods, PID, STD, ectopic pregnancy, ovarian cysts or infertility.             Past Medical History:        Diagnosis  Date         ?  Allergic rhinitis       ?  Aspergillosis (HCC)       ?  Asthma       ?  Asthma       ?  GERD (gastroesophageal reflux disease)           ?  Molluscum contagiosum             No past surgical history on file.          Family History:         Problem  Relation  Age of Onset          ?  Hypertension  Other       ?  Diabetes  Other       ?  Coronary Artery Disease  Other       ?  Other  Other  CEREBROVASCULAR ACCIDENT             Social History          Socioeconomic History         ?  Marital status:  SINGLE              Spouse name:  Not on file         ?  Number of children:  Not on file         ?  Years of education:  Not on file     ?  Highest education level:  Not on file       Occupational History        ?  Not on file       Tobacco Use         ?  Smoking status:  Never Smoker       Substance and Sexual Activity         ?  Alcohol use:  No     ?  Drug use:  No     ?  Sexual activity:  Never        Other Topics  Concern        ?  Not on file       Social History Narrative        ?  Not on file          Social Determinants of Health          Financial Resource Strain:         ?  Difficulty of Paying Living Expenses:        Food Insecurity:         ?  Worried About Programme researcher, broadcasting/film/video in the Last Year:      ?  Barista in the Last Year:        Transportation Needs:         ?  Freight forwarder (Medical):      ?  Lack of Transportation (Non-Medical):        Physical Activity:         ?  Days of Exercise per Week:      ?  Minutes of Exercise per Session:        Stress:         ?  Feeling of Stress :        Social Connections:         ?  Frequency of Communication with Friends and Family:      ?  Frequency of Social Gatherings with Friends and Family:      ?  Attends Religious Services:      ?  Active Member of Clubs or Organizations:      ?   Attends Banker Meetings:      ?  Marital Status:        Intimate Partner Violence:         ?  Fear of Current or Ex-Partner:      ?  Emotionally Abused:      ?  Physically Abused:         ?  Sexually Abused:               ALLERGIES: Fish containing products, Peanut oil, and Singulair [montelukast]      Review of Systems    Constitutional: Negative.  Negative for activity change, appetite change, chills, diaphoresis  and fever.    HENT: Negative.     Respiratory: Negative.  Negative for cough and shortness of breath.     Cardiovascular: Negative.     Gastrointestinal: Negative.  Negative for abdominal pain, anorexia, constipation, diarrhea, nausea and vomiting.    Genitourinary: Positive for vaginal discharge. Negative for dyspareunia, dysuria and frequency.    Musculoskeletal: Negative.  Negative for gait problem.    Neurological: Negative.     All other systems reviewed and are negative.           Vitals:          01/20/20 0822        BP:  120/66     Pulse:  99     Resp:  18     Temp:  98.6 ??F (37 ??C)     SpO2:  98%     Weight:  104.3 kg (230 lb)        Height:  5\' 8"  (1.727 m)                Physical Exam   Vitals and nursing note reviewed. Exam conducted with a chaperone present.   Constitutional:        General: She is not in acute distress.     Appearance: Normal appearance. She is not ill-appearing, toxic-appearing or diaphoretic.    HENT:       Head: Normocephalic and atraumatic.   Eyes :       Conjunctiva/sclera: Conjunctivae normal.   Cardiovascular:       Rate and Rhythm: Normal rate and regular rhythm.      Pulses: Normal pulses.      Heart sounds: Normal heart sounds.    Pulmonary:       Effort: Pulmonary effort is normal. No respiratory distress.      Breath sounds: Normal breath sounds and  air entry. No stridor, decreased air movement or transmitted upper airway sounds. No decreased breath sounds, wheezing, rhonchi or rales.   Chest:       Chest wall: No tenderness.   Abdominal :       Hernia: There is no hernia in the right inguinal area.    Genitourinary :      General: Normal vulva.      Exam position: Supine.      Pubic Area: No rash or pubic lice.       Labia:         Right: No rash, tenderness, lesion or injury.         Left: No rash, tenderness, lesion or injury.       Urethra:  No prolapse, urethral pain, urethral swelling or urethral lesion.      Vagina: No signs of injury and foreign body. Vaginal discharge  and erythema present. No tenderness, bleeding, lesions or prolapsed vaginal walls.      Cervix: No cervical motion tenderness, discharge,  friability, lesion, erythema, cervical bleeding or eversion.      Uterus: Normal.       Comments: Vaginal exam performed by student.  Performed by SANE nurse for training I was present  and observed as well.  Lymphadenopathy :       Lower Body: No right inguinal adenopathy. No left inguinal adenopathy.    Skin:      General: Skin is warm and dry.   Neurological :       General: No focal deficit present.      Mental Status: She  is alert and oriented to person, place, and time. Mental status is at baseline.             MDM   Number of Diagnoses or Management Options   Diagnosis management comments: Patient stable time discharge.  Will call patient with results after couple days.  We will treat patient preemptively for STDs.          Amount and/or Complexity of Data Reviewed   Clinical lab tests: ordered and reviewed      Risk of Complications, Morbidity, and/or Mortality   Presenting problems: moderate  Diagnostic procedures: low  Management options: low     Patient Progress   Patient progress: stable             Procedures

## 2020-01-20 NOTE — ED Notes (Signed)
 Pt states she has vaginal discharge starting at 10pm last night. She states the discharge is thin and grayish. States her last period was July 11th. Pt denies itching or pain, but states everything does feel irritated.

## 2020-01-22 LAB — CHLAMYDIA / GC-AMPLIFIED
CHLAMYDIA TRACHOMATIS, NAA, 188078: NEGATIVE
Chlamydia trachomatis, NAA: NEGATIVE
NEISSERIA GONORRHOEAE, NAA, 188086: NEGATIVE
Neisseria gonorrhoeae, NAA: NEGATIVE

## 2020-05-05 ENCOUNTER — Inpatient Hospital Stay
Admit: 2020-05-05 | Discharge: 2020-05-05 | Disposition: A | Discharge status: Home / Self Care | Attending: Emergency Medicine

## 2020-05-05 DIAGNOSIS — L739 Follicular disorder, unspecified: Secondary | ICD-10-CM

## 2020-05-05 LAB — URINE MICROSCOPIC
BACTERIA, URINE: 0 /hpf
Bacteria: 0 /hpf
Casts UA: 0 /lpf
Casts: 0 /lpf
MUCUS, URINE: 0 /lpf
Mucus: 0 /lpf

## 2020-05-05 LAB — HCG URINE, QL. - POC
HCG, Pregnancy, Urine, POC: NEGATIVE
Pregnancy test,urine (POC): NEGATIVE

## 2020-05-05 NOTE — ED Notes (Signed)
 Pt c/o vaginal rash X1 day and is concerned about HSV2. Pt denies any painful urination or discharge. Pt states my skin feels raw. Pt masked.

## 2020-05-05 NOTE — ED Notes (Signed)
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
## 2020-05-25 ENCOUNTER — Inpatient Hospital Stay
Admit: 2020-05-25 | Discharge: 2020-05-25 | Disposition: A | Discharge status: Home / Self Care | Attending: Emergency Medicine

## 2020-05-25 DIAGNOSIS — N760 Acute vaginitis: Secondary | ICD-10-CM

## 2020-05-25 LAB — URINALYSIS W/ RFLX MICROSCOPIC
Bilirubin, Urine: NEGATIVE
Bilirubin: NEGATIVE
Glucose, Ur: NEGATIVE mg/dL
Glucose: NEGATIVE mg/dL
Ketone: NEGATIVE mg/dL
Ketones, Urine: NEGATIVE mg/dL
Nitrite, Urine: NEGATIVE
Nitrites: NEGATIVE
Protein, UA: NEGATIVE mg/dL
Protein: NEGATIVE mg/dL
Specific Gravity, UA: 1.016 (ref 1.001–1.023)
Specific gravity: 1.016 (ref 1.001–1.023)
Urobilinogen, UA, POCT: 1 EU/dL (ref 0.2–1.0)
Urobilinogen: 1 EU/dL (ref 0.2–1.0)
pH (UA): 6 (ref 5.0–9.0)
pH, UA: 6 (ref 5.0–9.0)

## 2020-05-25 LAB — WET PREP
Wet Prep: NONE SEEN
Wet Prep: NONE SEEN
Wet prep: NONE SEEN
Wet prep: NONE SEEN

## 2020-05-25 LAB — URINE MICROSCOPIC
Casts UA: 0 /lpf
Casts: 0 /lpf
Crystals, UA: 0 /LPF
Crystals, urine: 0 /LPF
MUCUS, URINE: 0 /lpf
Mucus: 0 /lpf
RBC, UA: 0 /hpf
RBC: 0 /hpf
WBC, UA: >100 /hpf
WBC: >100 /hpf

## 2020-05-25 LAB — HCG URINE, QL
HCG urine, QL: NEGATIVE
Pregnancy Test(Urn): NEGATIVE

## 2020-05-25 MED ORDER — CEPHALEXIN 500 MG CAP
500 mg | ORAL_CAPSULE | Freq: Four times a day (QID) | ORAL | 0 refills | Status: AC
Start: 2020-05-25 — End: 2020-06-01

## 2020-05-25 MED ORDER — CEFTRIAXONE 500 MG SOLUTION FOR INJECTION
500 mg | Freq: Once | INTRAMUSCULAR | Status: DC
Start: 2020-05-25 — End: 2020-05-25

## 2020-05-25 MED ORDER — METRONIDAZOLE 500 MG TAB
500 mg | ORAL_TABLET | Freq: Two times a day (BID) | ORAL | 0 refills | Status: AC
Start: 2020-05-25 — End: 2020-06-01

## 2020-05-25 NOTE — ED Provider Notes (Signed)
ED Provider Notes by Andris Flurry, PA at 05/25/20 [phone]                Author: Andris Flurry, PA  Service: Emergency Medicine  Author Type: Physician Assistant       Filed: 05/25/20 0857  Date of Service: 05/25/20 0835  Status: Attested           Editor: Andris Flurry, PA (Physician Assistant)  Cosigner: Danford Bad, DO at 05/25/20 1047          Attestation signed by Danford Bad, DO at 05/25/20 1047          I have reviewed the documentation from the [facility] Clinician and agree with the assessment and plan as presented.    I was available in the ED, but did not materially participate in the care of this patient.    Danford Bad, DO; 05/25/2020; 10:47 AM                                  Patient to ER stating she woke this morning with some grayish-white discharge.  She denies any abdominal pain,  no vaginal bleeding.  Patient has a birth control implant.  She states she has a history of BV.  Patient is denying any unprotected sex      The history is provided by the patient.    Vaginal Discharge   This is a new problem. The  current episode started 1 to 2 hours ago. The problem  occurs constantly. The problem has not changed since  onset.Pertinent negatives include no chest pain and  no abdominal pain. Nothing aggravates the symptoms. Nothing relieves the symptoms. She has tried nothing for the symptoms. The  treatment provided no relief.             Past Medical History:        Diagnosis  Date         ?  Allergic rhinitis       ?  Aspergillosis (HCC)       ?  Asthma       ?  Asthma       ?  GERD (gastroesophageal reflux disease)           ?  Molluscum contagiosum             No past surgical history on file.          Family History:         Problem  Relation  Age of Onset          ?  Hypertension  Other       ?  Diabetes  Other       ?  Coronary Art Dis  Other       ?  Other  Other                CEREBROVASCULAR ACCIDENT             Social History           Socioeconomic History         ?  Marital status:  SINGLE              Spouse name:  Not on file         ?  Number of children:  Not on file     ?  Years of education:  Not on file     ?  Highest education level:  Not on file       Occupational History        ?  Not on file       Tobacco Use         ?  Smoking status:  Never Smoker     ?  Smokeless tobacco:  Not on file       Substance and Sexual Activity         ?  Alcohol use:  No     ?  Drug use:  No     ?  Sexual activity:  Never        Other Topics  Concern        ?  Not on file       Social History Narrative        ?  Not on file          Social Determinants of Health          Financial Resource Strain:         ?  Difficulty of Paying Living Expenses: Not on file       Food Insecurity:         ?  Worried About Running Out of Food in the Last Year: Not on file     ?  Ran Out of Food in the Last Year: Not on file       Transportation Needs:         ?  Lack of Transportation (Medical): Not on file     ?  Lack of Transportation (Non-Medical): Not on file       Physical Activity:         ?  Days of Exercise per Week: Not on file     ?  Minutes of Exercise per Session: Not on file       Stress:         ?  Feeling of Stress : Not on file       Social Connections:         ?  Frequency of Communication with Friends and Family: Not on file     ?  Frequency of Social Gatherings with Friends and Family: Not on file     ?  Attends Religious Services: Not on file     ?  Active Member of Clubs or Organizations: Not on file     ?  Attends Banker Meetings: Not on file     ?  Marital Status: Not on file       Intimate Partner Violence:         ?  Fear of Current or Ex-Partner: Not on file     ?  Emotionally Abused: Not on file     ?  Physically Abused: Not on file     ?  Sexually Abused: Not on file       Housing Stability:         ?  Unable to Pay for Housing in the Last Year: Not on file     ?  Number of Places Lived in the Last Year: Not on file        ?   Unstable Housing in the Last Year: Not on file              ALLERGIES: Fish containing products, Peanut oil, and Singulair [  montelukast]      Review of Systems    Cardiovascular: Negative for chest pain.    Gastrointestinal: Negative for abdominal pain.    Genitourinary: Positive for vaginal discharge.    All other systems reviewed and are negative.           Vitals:          05/25/20 0752        BP:  122/70     Pulse:  76     Resp:  18     Temp:  98.1 ??F (36.7 ??C)     SpO2:  98%     Weight:  97.5 kg (215 lb)        Height:  5\' 8"  (1.727 m)                Physical Exam   Vitals and nursing note reviewed.   Constitutional:        General: She is not in acute distress.     Appearance: Normal appearance. She is well-developed and normal weight. She is not diaphoretic.   HENT:       Head: Normocephalic and atraumatic.      Right Ear: External ear normal.      Left Ear: External ear normal.      Nose: Nose normal.   Eyes:       Pupils: Pupils are equal, round, and reactive to light.   Cardiovascular:       Rate and Rhythm: Normal rate and regular rhythm.   Pulmonary :       Effort: Pulmonary effort is normal.      Breath sounds: Normal breath sounds.   Abdominal :      General: Bowel sounds are normal.      Palpations: Abdomen is soft.      Tenderness: There is no abdominal tenderness.      Hernia: No hernia is present.     Musculoskeletal:          General: Normal range of motion.      Cervical back: Normal range of motion and neck supple.    Skin:      General: Skin is warm.   Neurological :       General: No focal deficit present.      Mental Status: She is alert and oriented to person, place, and time.    Psychiatric:         Mood and Affect: Mood normal.         Behavior: Behavior normal.              MDM   Number of Diagnoses or Management Options   Diagnosis management comments: Patient did self swabbing wet prep positive for white cells and clue cells.  Patient refused Rocephin in the ER stating she wanted to  wait for final results feels she only has BV.  Patient requested work note for today   Urine pregnancy test negative, urine + uti will treat           Amount and/or Complexity of Data Reviewed   Clinical lab tests: reviewed and ordered    Review and summarize past medical records: yes      Risk of Complications, Morbidity, and/or Mortality   Presenting problems: low  Diagnostic procedures: low  Management options: low     Patient Progress   Patient progress: improved             Procedures

## 2020-05-25 NOTE — ED Notes (Signed)
Pt states that she thinks she has BV. PT has been having vaginal itching and irritation since yesterday. Gray vaginal discharge since this am.

## 2020-05-25 NOTE — ED Notes (Signed)
I have reviewed discharge instructions with the patient.  The patient verbalized understanding.    Patient left ED via Discharge Method: ambulatory to Home.    Opportunity for questions and clarification provided.       Patient given 2 scripts.         To continue your aftercare when you leave the hospital, you may receive an automated call from our care team to check in on how you are doing.  This is a free service and part of our promise to provide the best care and service to meet your aftercare needs." If you have questions, or wish to unsubscribe from this service please call [phone].  Thank you for Choosing our [facility].

## 2020-05-28 LAB — CHLAMYDIA / GC-AMPLIFIED
CHLAMYDIA TRACHOMATIS, NAA, 188078: NEGATIVE
Chlamydia trachomatis, NAA: NEGATIVE
NEISSERIA GONORRHOEAE, NAA, 188086: NEGATIVE
Neisseria gonorrhoeae, NAA: NEGATIVE

## 2021-01-12 DIAGNOSIS — S8012XA Contusion of left lower leg, initial encounter: Secondary | ICD-10-CM
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
## 2021-01-12 NOTE — Discharge Instructions (Addendum)
Aspirin or Motrin for Pain  Apply warm compresses to area

## 2021-01-12 NOTE — ED Notes (Signed)
I have reviewed discharge instructions with the patient.  The patient verbalized understanding.    Patient left ED via Discharge Method: ambulatory to Home with ( self).    Opportunity for questions and clarification provided.       Patient given 1 scripts.         To continue your aftercare when you leave the hospital, you may receive an automated call from our care team to check in on how you are doing.  This is a free service and part of our promise to provide the best care and service to meet your aftercare needs.??? If you have questions, or wish to unsubscribe from this service please call [phone].  Thank you for Choosing our [facility].       Morton Amy, RN  01/12/21 2202
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
## 2021-01-13 ENCOUNTER — Inpatient Hospital Stay: Admit: 2021-01-13 | Primary: Sports Medicine

## 2021-01-13 ENCOUNTER — Inpatient Hospital Stay
Admit: 2021-01-13 | Discharge: 2021-01-13 | Disposition: A | Discharge status: Home / Self Care | Attending: Emergency Medicine

## 2021-01-13 MED ORDER — ALBUTEROL SULFATE HFA 108 (90 BASE) MCG/ACT IN AERS
108 (90 Base) MCG/ACT | Freq: Four times a day (QID) | RESPIRATORY_TRACT | 2 refills | Status: AC | PRN
Start: 2021-01-13 — End: 2022-01-29

## 2022-01-25 ENCOUNTER — Inpatient Hospital Stay
Admission: EM | Admit: 2022-01-25 | Discharge: 2022-01-29 | Disposition: A | Discharge status: Home / Self Care | Source: Other Acute Inpatient Hospital | Admitting: Student in an Organized Health Care Education/Training Program

## 2022-01-25 ENCOUNTER — Emergency Department: Admit: 2022-01-25 | Primary: Sports Medicine

## 2022-01-25 DIAGNOSIS — J4551 Severe persistent asthma with (acute) exacerbation: Secondary | ICD-10-CM

## 2022-01-25 DIAGNOSIS — R0602 Shortness of breath: Secondary | ICD-10-CM

## 2022-01-25 LAB — CBC WITH AUTO DIFFERENTIAL
Absolute Immature Granulocyte: 0 10*3/uL (ref 0.0–0.5)
Basophils %: 1 % (ref 0.0–2.0)
Basophils Absolute: 0.1 10*3/uL (ref 0.0–0.2)
Eosinophils %: 4 % (ref 0.5–7.8)
Eosinophils Absolute: 0.5 10*3/uL (ref 0.0–0.8)
Hematocrit: 42.5 % (ref 35.8–46.3)
Hemoglobin: 13.8 g/dL (ref 11.7–15.4)
Immature Granulocytes: 0 % (ref 0.0–5.0)
Lymphocytes %: 36 % (ref 13–44)
Lymphocytes Absolute: 4.5 10*3/uL (ref 0.5–4.6)
MCH: 31 PG (ref 26.1–32.9)
MCHC: 32.5 g/dL (ref 31.4–35.0)
MCV: 95.5 FL (ref 82.0–102.0)
MPV: 10.1 FL (ref 9.4–12.3)
Monocytes %: 8 % (ref 4.0–12.0)
Monocytes Absolute: 0.9 10*3/uL (ref 0.1–1.3)
Neutrophils %: 51 % (ref 43–78)
Neutrophils Absolute: 6.4 10*3/uL (ref 1.7–8.2)
Platelets: 405 10*3/uL (ref 150–450)
RBC: 4.45 M/uL (ref 4.05–5.2)
RDW: 12.3 % (ref 11.9–14.6)
WBC: 12.5 10*3/uL — ABNORMAL HIGH (ref 4.3–11.1)
nRBC: 0 10*3/uL (ref 0.0–0.2)

## 2022-01-25 LAB — COMPREHENSIVE METABOLIC PANEL
ALT: 65 U/L (ref 12–65)
AST: 47 U/L — ABNORMAL HIGH (ref 15–37)
Albumin/Globulin Ratio: 0.8 (ref 0.4–1.6)
Albumin: 3.5 g/dL (ref 3.5–5.0)
Alk Phosphatase: 134 U/L (ref 50–136)
Anion Gap: 5 mmol/L (ref 2–11)
BUN: 10 MG/DL (ref 6–23)
CO2: 23 mmol/L (ref 21–32)
Calcium: 8.8 MG/DL (ref 8.3–10.4)
Chloride: 111 mmol/L — ABNORMAL HIGH (ref 101–110)
Creatinine: 0.93 MG/DL (ref 0.6–1.0)
Est, Glom Filt Rate: >60 mL/min/{1.73_m2} (ref >60)
Globulin: 4.2 g/dL (ref 2.8–4.5)
Glucose: 103 mg/dL — ABNORMAL HIGH (ref 65–100)
Potassium: 4.2 mmol/L (ref 3.5–5.1)
Sodium: 139 mmol/L (ref 133–143)
Total Bilirubin: 0.3 MG/DL (ref 0.2–1.1)
Total Protein: 7.7 g/dL (ref 6.3–8.2)

## 2022-01-25 LAB — ARTERIAL BLOOD GAS, POC
BASE DEFICIT (POC): 1.1 mmol/L
FIO2: 2
HCO3, Mixed: 21.2 MMOL/L — ABNORMAL LOW (ref 22–26)
POC Allen's Test: POSITIVE
Pt Temp: 98
SO2c, Arterial, POC: 97.8 % (ref 95–98)
pCO2, Arterial, POC: 28.4 MMHG — ABNORMAL LOW (ref 35–45)
pH, Arterial, POC: 7.48 — ABNORMAL HIGH (ref 7.35–7.45)
pO2, Arterial, POC: 90 MMHG (ref 75–100)

## 2022-01-25 LAB — EKG 12-LEAD
Atrial Rate: 109 {beats}/min
P Axis: 75 degrees
P-R Interval: 136 ms
Q-T Interval: 322 ms
QRS Duration: 87 ms
QTc Calculation (Bazett): 436 ms
R Axis: 18 degrees
T Axis: 45 degrees
Ventricular Rate: 110 {beats}/min

## 2022-01-25 LAB — EOSINOPHIL, ABSOLUTE: Eosinophils Absolute: 0.48 10*3/uL

## 2022-01-25 MED ORDER — BUDESONIDE 0.5 MG/2ML IN SUSP
0.5 MG/2ML | Freq: Two times a day (BID) | RESPIRATORY_TRACT | Status: AC
Start: 2022-01-25 — End: 2022-01-29
  Administered 2022-01-25 – 2022-01-29 (×9): 500 mg via RESPIRATORY_TRACT

## 2022-01-25 MED ORDER — ALBUTEROL SULFATE (2.5 MG/3ML) 0.083% IN NEBU
RESPIRATORY_TRACT | Status: AC
Start: 2022-01-25 — End: 2022-01-25
  Administered 2022-01-25: 12:00:00 15 mg/h via RESPIRATORY_TRACT

## 2022-01-25 MED ORDER — IPRATROPIUM-ALBUTEROL 0.5-2.5 (3) MG/3ML IN SOLN
RESPIRATORY_TRACT | Status: AC
Start: 2022-01-25 — End: 2022-01-25
  Administered 2022-01-25: 14:00:00 1 via RESPIRATORY_TRACT

## 2022-01-25 MED ORDER — NORMAL SALINE FLUSH 0.9 % IV SOLN
0.9 % | Freq: Two times a day (BID) | INTRAVENOUS | Status: AC
Start: 2022-01-25 — End: 2022-01-29
  Administered 2022-01-26 – 2022-01-29 (×7): 10 mL via INTRAVENOUS

## 2022-01-25 MED ORDER — NORMAL SALINE FLUSH 0.9 % IV SOLN
0.9 % | INTRAVENOUS | Status: AC | PRN
Start: 2022-01-25 — End: 2022-01-29

## 2022-01-25 MED ORDER — ONDANSETRON HCL 4 MG/2ML IJ SOLN
4 MG/2ML | Freq: Four times a day (QID) | INTRAMUSCULAR | Status: AC | PRN
Start: 2022-01-25 — End: 2022-01-29

## 2022-01-25 MED ORDER — MAGNESIUM SULFATE 2000 MG/50 ML IVPB PREMIX
2 GM/50ML | INTRAVENOUS | Status: AC
Start: 2022-01-25 — End: 2022-01-25
  Administered 2022-01-25: 12:00:00 2000 mg via INTRAVENOUS

## 2022-01-25 MED ORDER — ALBUTEROL SULFATE (2.5 MG/3ML) 0.083% IN NEBU
Freq: Four times a day (QID) | RESPIRATORY_TRACT | Status: AC
Start: 2022-01-25 — End: 2022-01-27
  Administered 2022-01-25 – 2022-01-27 (×7): 2.5 mg via RESPIRATORY_TRACT

## 2022-01-25 MED ORDER — STERILE WATER FOR INJECTION (MIXTURES ONLY)
125 MG | Freq: Four times a day (QID) | INTRAMUSCULAR | Status: AC
Start: 2022-01-25 — End: 2022-01-26
  Administered 2022-01-25 – 2022-01-26 (×4): 60 mg via INTRAVENOUS

## 2022-01-25 MED ORDER — ACETAMINOPHEN 650 MG RE SUPP
650 | Freq: Four times a day (QID) | RECTAL | Status: DC | PRN
Start: 2022-01-25 — End: 2022-01-29

## 2022-01-25 MED ORDER — SODIUM CHLORIDE 0.9 % IV SOLN
0.9 % | INTRAVENOUS | Status: AC | PRN
Start: 2022-01-25 — End: 2022-01-29

## 2022-01-25 MED ORDER — DEXAMETHASONE SODIUM PHOSPHATE 10 MG/ML IJ SOLN
10 MG/ML | INTRAMUSCULAR | Status: AC
Start: 2022-01-25 — End: 2022-01-25
  Administered 2022-01-25: 12:00:00 10 mg via INTRAVENOUS

## 2022-01-25 MED ORDER — POLYETHYLENE GLYCOL 3350 17 G PO PACK
17 g | Freq: Every day | ORAL | Status: AC | PRN
Start: 2022-01-25 — End: 2022-01-29

## 2022-01-25 MED ORDER — CETIRIZINE HCL 10 MG PO TABS
10 MG | Freq: Every day | ORAL | Status: AC
Start: 2022-01-25 — End: 2022-01-29
  Administered 2022-01-25 – 2022-01-29 (×5): 10 mg via ORAL

## 2022-01-25 MED ORDER — PREDNISONE 20 MG PO TABS
20 MG | ORAL | Status: AC
Start: 2022-01-25 — End: 2022-01-25
  Administered 2022-01-25: 14:00:00 40 mg via ORAL

## 2022-01-25 MED ORDER — MONTELUKAST SODIUM 10 MG PO TABS
10 MG | Freq: Every evening | ORAL | Status: AC
Start: 2022-01-25 — End: 2022-01-29
  Administered 2022-01-26 – 2022-01-29 (×4): 10 mg via ORAL

## 2022-01-25 MED ORDER — IPRATROPIUM BROMIDE 0.02 % IN SOLN
0.02 % | RESPIRATORY_TRACT | Status: AC
Start: 2022-01-25 — End: 2022-01-25
  Administered 2022-01-25: 12:00:00 0.5 mg via RESPIRATORY_TRACT

## 2022-01-25 MED ORDER — ACETAMINOPHEN 325 MG PO TABS
325 | Freq: Four times a day (QID) | ORAL | Status: DC | PRN
Start: 2022-01-25 — End: 2022-01-29
  Administered 2022-01-27: 21:00:00 650 mg via ORAL

## 2022-01-25 MED ORDER — PANTOPRAZOLE SODIUM 40 MG PO TBEC
40 MG | Freq: Every day | ORAL | Status: AC
Start: 2022-01-25 — End: 2022-01-29
  Administered 2022-01-25 – 2022-01-29 (×5): 40 mg via ORAL

## 2022-01-25 MED ORDER — ALBUTEROL SULFATE (2.5 MG/3ML) 0.083% IN NEBU
RESPIRATORY_TRACT | Status: AC
Start: 2022-01-25 — End: 2022-01-25
  Administered 2022-01-27: 01:00:00 2.5 via RESPIRATORY_TRACT

## 2022-01-25 MED ORDER — ONDANSETRON 4 MG PO TBDP
4 MG | Freq: Three times a day (TID) | ORAL | Status: AC | PRN
Start: 2022-01-25 — End: 2022-01-29

## 2022-01-25 MED ORDER — ALBUTEROL SULFATE (2.5 MG/3ML) 0.083% IN NEBU
RESPIRATORY_TRACT | Status: AC
Start: 2022-01-25 — End: 2022-01-25
  Administered 2022-01-25: 12:00:00 10 mg via RESPIRATORY_TRACT

## 2022-01-25 MED FILL — ALBUTEROL SULFATE (2.5 MG/3ML) 0.083% IN NEBU: RESPIRATORY_TRACT | Qty: 12

## 2022-01-25 MED FILL — ALBUTEROL SULFATE (2.5 MG/3ML) 0.083% IN NEBU: RESPIRATORY_TRACT | Qty: 3

## 2022-01-25 MED FILL — DEXAMETHASONE SODIUM PHOSPHATE 10 MG/ML IJ SOLN: 10 MG/ML | INTRAMUSCULAR | Qty: 1

## 2022-01-25 MED FILL — CETIRIZINE HCL 10 MG PO TABS: 10 MG | ORAL | Qty: 1

## 2022-01-25 MED FILL — METHYLPREDNISOLONE SODIUM SUCC 125 MG IJ SOLR: 125 MG | INTRAMUSCULAR | Qty: 125

## 2022-01-25 MED FILL — MAGNESIUM SULFATE 2 GM/50ML IV SOLN: 2 GM/50ML | INTRAVENOUS | Qty: 50

## 2022-01-25 MED FILL — BUDESONIDE 0.5 MG/2ML IN SUSP: 0.5 MG/2ML | RESPIRATORY_TRACT | Qty: 2

## 2022-01-25 MED FILL — IPRATROPIUM-ALBUTEROL 0.5-2.5 (3) MG/3ML IN SOLN: RESPIRATORY_TRACT | Qty: 3

## 2022-01-25 MED FILL — PREDNISONE 20 MG PO TABS: 20 MG | ORAL | Qty: 2

## 2022-01-25 MED FILL — IPRATROPIUM BROMIDE 0.02 % IN SOLN: 0.02 % | RESPIRATORY_TRACT | Qty: 2.5

## 2022-01-25 MED FILL — PANTOPRAZOLE SODIUM 40 MG PO TBEC: 40 MG | ORAL | Qty: 1

## 2022-01-25 NOTE — H&P (Signed)
Hospitalist History and Physical   Admit Date:  01/25/2022  7:25 AM   Name:  Ann Medina   Age:  21 y.o.  Sex:  female  DOB:  March 07, 2001   MRN:  [id number]   Room:  340/01    Presenting/Chief Complaint: Shortness of Breath     Reason(s) for Admission: Shortness of breath [R06.02]  Acute asthma exacerbation [J45.901]  Acute exacerbation of extrinsic asthma [J45.901]     History of Present Illness:       Ann Medina is a 21 y.o. female with medical history of asthma, allergies, GERD who is evaluated with shortness of breath/ wheezing.      She has been followed by [facility] but insurance lapsed.  Ran out of her meds this morning.     Had increased shortness of breath/wheezing since yesterday.  No fever  Feeling a little better after ER treatment.       Gets short of breath walking and talking      FULL CODE  Mother is Claudette Laws [phone]          Assessment & Plan:     Principal Problem:    Acute asthma exacerbation  Plan:   O2 as needed  Pulse ox continuous  Remote tele   D1 IV solumedrol 60 mgevery 6 hours  Albuterol nebs every 6 hours   Pulmicort nebs every 12 hours   Singulair  Zyrtec  Protonix  Pulmonary consult   ABG  Urine pregnancy test  Close re eval and low threshold for increased monitoring           Active Problems:    GERD (gastroesophageal reflux disease)  Plan:   PPI          Allergic rhinitis  Plan:   Zyrtec, singulair      Elevated AST:  Repeat lab tomorrow      PT/OT evals and PPD needed/ordered?  No  Diet: ADULT DIET; Regular  VTE prophylaxis: SCD's   Code status: Full Code    Hospital Problems:  Principal Problem:    Acute asthma exacerbation  Active Problems:    GERD (gastroesophageal reflux disease)    Allergic rhinitis  Resolved Problems:    * No resolved hospital problems. *       Past History:     Past Medical History:   Diagnosis Date    Allergic rhinitis     Aspergillosis (HCC)     Asthma     Asthma     GERD (gastroesophageal reflux disease)     Molluscum contagiosum         PSH- none     Social History     Tobacco Use    Smoking status: Never    Smokeless tobacco: Not on file   Substance Use Topics    Alcohol use: rare      Social History     Substance and Sexual Activity   Drug Use No       Family History   Problem Relation Age of Onset    Other Other         CEREBROVASCULAR ACCIDENT    Hypertension Other     Coronary Art Dis Other     Diabetes Other         Immunization History   Administered Date(s) Administered    DTaP vaccine 02/15/1999, 04/21/1999, 06/22/1999, 12/18/2000, 11/12/2003    Hepatitis B vaccine 11-20-1998, 06/22/1999, 09/16/1999    Hib vaccine 02/15/1999,  04/21/1999, 06/22/1999, 05/03/2000    Influenza Virus Vaccine 08/10/2006, 02/28/2008    MMR, PRIORIX, M-M-R II, (age 72m), SC, 0.523m11/14/2001, 11/12/2003    Pneumococcal, PCV-13, PREVNAR 13, (age 6w+), IM, 0.15m62m8/28/2000, 04/21/1999, 06/22/1999, 05/02/2000    Poliovirus, IPOL, (age 6w+), SC/IM, 0.15mL59m/28/2000, 04/21/1999, 09/16/1999, 11/12/2003    Varicella, VARIVAX, (age 63m+41mC, 0.15mL 119m4/2001, 01/08/2009     Allergies   Allergen Reactions    Peanut Oil Other (See Comments)    Montelukast Rash     States not allergic to this med     Prior to Admit Medications:  Current Outpatient Medications   Medication Instructions    albuterol sulfate HFA (PROVENTIL HFA) 108 (90 Base) MCG/ACT inhaler 2 puffs, Inhalation, EVERY 6 HOURS PRN    budesonide (PULMICORT) 500 mcg    budesonide-formoterol (SYMBICORT) 80-4.5 MCG/ACT AERO Inhale into the lungs    cetirizine (ZYRTEC) 10 mg, Oral, DAILY    fluticasone (FLONASE) 50 MCG/ACT nasal spray 2 sprays    fluticasone-salmeterol (ADVAIR DISKUS) 250-50 MCG/ACT AEPB diskus inhaler 1 puff, Inhalation, EVERY 12 HOURS    levalbuterol (XOPENEX) 0.31 mg, EVERY 4 HOURS PRN    montelukast (SINGULAIR) 10 mg, Oral, DAILY    tiotropium (SPIRIVA) 18 MCG inhalation capsule 1 capsule, Inhalation, DAILY         Objective:   Patient Vitals for the past 24 hrs:   Temp Pulse Resp BP SpO2    01/25/22 1149 -- (!) 105 18 -- 95 %   01/25/22 1114 98.4 F (36.9 C) 91 21 126/84 96 %   01/25/22 1014 -- 91 -- -- 95 %   01/25/22 0936 -- 76 -- -- 100 %   01/25/22 0909 -- 93 -- -- 99 %   01/25/22 0900 -- 94 24 (!) 137/95 99 %   01/25/22 0820 -- (!) 112 21 (!) 124/94 100 %   01/25/22 0803 -- (!) 115 19 -- 100 %   01/25/22 0750 -- (!) 113 -- -- 99 %   01/25/22 0737 -- (!) 127 -- -- 100 %   01/25/22 0730 98.1 F (36.7 C) (!) 138 25 (!) 151/103 (!) 87 %       Oxygen Therapy  SpO2: 95 %  Pulse via Oximetry: 79 beats per minute  O2 Device: Nasal cannula  O2 Flow Rate (L/min): 2 L/min    Estimated body mass index is 34.97 kg/m as calculated from the following:    Height as of this encounter: _0  (1.727 m).    Weight as of this encounter: 230 lb (104.3 kg).  No intake or output data in the 24 hours ending 01/25/22 1154      Physical Exam:    General:    Well nourished.  Alert, some shortness of breath talking, audible expiratory wheezing   Head:  Normocephalic, atraumatic  Eyes:  Sclerae appear normal.  Pupils equally round.  ENT:  Nares appear normal.  Moist oral mucosa  Neck:  No restricted ROM.  Trachea midline   CV:   RRR.  No m/r/g.  No jugular venous distension. No edema   Lungs:   Diffuse expiratory wheezing  Abdomen:   Soft, nontender, nondistended.  Extremities: No cyanosis or clubbing.  No edema  Skin:     No rashes and normal coloration.   Warm and dry.    Neuro:   grossly intact.    Psych:  Normal mood and affect.      Orders Placed This Encounter  Medications    dexamethasone (DECADRON) injection 10 mg    albuterol (PROVENTIL) (2.5 MG/3ML) 0.083% nebulizer solution     Order Specific Question:   Initiate RT Bronchodilator Protocol     Answer:   No    magnesium sulfate 2000 mg in 50 mL IVPB premix    ipratropium (ATROVENT) 0.02 % nebulizer solution 0.5 mg     Order Specific Question:   Initiate RT Bronchodilator Protocol     Answer:   No    albuterol (PROVENTIL) (2.5 MG/3ML) 0.083% nebulizer solution      Leafy Ro: cabinet override    albuterol (PROVENTIL) (2.5 MG/3ML) 0.083% nebulizer solution 10 mg     Order Specific Question:   Initiate RT Bronchodilator Protocol     Answer:   Yes - Inpatient Protocol    predniSONE (DELTASONE) tablet 40 mg    ipratropium 0.5 mg-albuterol 2.5 mg (DUONEB) nebulizer solution 1 Dose     Order Specific Question:   Initiate RT Bronchodilator Protocol     Answer:   No    albuterol (PROVENTIL) (2.5 MG/3ML) 0.083% nebulizer solution 2.5 mg     Order Specific Question:   Initiate RT Bronchodilator Protocol     Answer:   Yes - Inpatient Protocol    budesonide (PULMICORT) nebulizer suspension 500 mcg    methylPREDNISolone sodium succ (SOLU-MEDROL) 60 mg in sterile water 0.96 mL injection    sodium chloride flush 0.9 % injection 5-40 mL    sodium chloride flush 0.9 % injection 5-40 mL    0.9 % sodium chloride infusion    OR Linked Order Group     ondansetron (ZOFRAN-ODT) disintegrating tablet 4 mg     ondansetron (ZOFRAN) injection 4 mg    polyethylene glycol (GLYCOLAX) packet 17 g    OR Linked Order Group     acetaminophen (TYLENOL) tablet 650 mg     acetaminophen (TYLENOL) suppository 650 mg    cetirizine (ZYRTEC) tablet 10 mg    montelukast (SINGULAIR) tablet 10 mg    pantoprazole (PROTONIX) tablet 40 mg       I have personally reviewed labs and tests:  Recent Labs:  Recent Results (from the past 24 hour(s))   CBC with Auto Differential    Collection Time: 01/25/22  7:37 AM   Result Value Ref Range    WBC 12.5 (H) 4.3 - 11.1 K/uL    RBC 4.45 4.05 - 5.2 M/uL    Hemoglobin 13.8 11.7 - 15.4 g/dL    Hematocrit 42.5 35.8 - 46.3 %    MCV 95.5 82.0 - 102.0 FL    MCH 31.0 26.1 - 32.9 PG    MCHC 32.5 31.4 - 35.0 g/dL    RDW 12.3 11.9 - 14.6 %    Platelets 405 150 - 450 K/uL    MPV 10.1 9.4 - 12.3 FL    nRBC 0.00 0.0 - 0.2 K/uL    Differential Type AUTOMATED      Neutrophils % 51 43 - 78 %    Lymphocytes % 36 13 - 44 %    Monocytes % 8 4.0 - 12.0 %    Eosinophils % 4 0.5 - 7.8 %     Basophils % 1 0.0 - 2.0 %    Immature Granulocytes 0 0.0 - 5.0 %    Neutrophils Absolute 6.4 1.7 - 8.2 K/UL    Lymphocytes Absolute 4.5 0.5 - 4.6 K/UL    Monocytes Absolute 0.9 0.1 - 1.3 K/UL  Eosinophils Absolute 0.5 0.0 - 0.8 K/UL    Basophils Absolute 0.1 0.0 - 0.2 K/UL    Absolute Immature Granulocyte 0.0 0.0 - 0.5 K/UL   Comprehensive Metabolic Panel    Collection Time: 01/25/22  7:37 AM   Result Value Ref Range    Sodium 139 133 - 143 mmol/L    Potassium 4.2 3.5 - 5.1 mmol/L    Chloride 111 (H) 101 - 110 mmol/L    CO2 23 21 - 32 mmol/L    Anion Gap 5 2 - 11 mmol/L    Glucose 103 (H) 65 - 100 mg/dL    BUN 10 6 - 23 MG/DL    Creatinine 0.93 0.6 - 1.0 MG/DL    Est, Glom Filt Rate >60 >60 ml/min/1.80m    Calcium 8.8 8.3 - 10.4 MG/DL    Total Bilirubin 0.3 0.2 - 1.1 MG/DL    ALT 65 12 - 65 U/L    AST 47 (H) 15 - 37 U/L    Alk Phosphatase 134 50 - 136 U/L    Total Protein 7.7 6.3 - 8.2 g/dL    Albumin 3.5 3.5 - 5.0 g/dL    Globulin 4.2 2.8 - 4.5 g/dL    Albumin/Globulin Ratio 0.8 0.4 - 1.6     EKG 12 Lead    Collection Time: 01/25/22  8:00 AM   Result Value Ref Range    Ventricular Rate 110 BPM    Atrial Rate 109 BPM    P-R Interval 136 ms    QRS Duration 87 ms    Q-T Interval 322 ms    QTc Calculation (Bazett) 436 ms    P Axis 75 degrees    R Axis 18 degrees    T Axis 45 degrees    Diagnosis       Sinus tachycardia  RAE, consider biatrial enlargement  Baseline wander in lead(s) I II aVR     Arterial Blood Gas, POC    Collection Time: 01/25/22 11:40 AM   Result Value Ref Range    DEVICE NASAL CANNULA      pH, Arterial, POC 7.48 (H) 7.35 - 7.45      pCO2, Arterial, POC 28.4 (L) 35 - 45 MMHG    pO2, Arterial, POC 90 75 - 100 MMHG    HCO3, Mixed 21.2 (L) 22 - 26 MMOL/L    SO2c, Arterial, POC 97.8 95 - 98 %    BASE DEFICIT (POC) 1.1 mmol/L    POC Allen's Test Positive      Site RIGHT RADIAL      Pt Temp 98      Specimen type: ARTERIAL      Performed by: ReedJacquelineRT     Critical Value Read Back DRDAVIS     FIO2 2          I have personally reviewed imaging studies:  XR CHEST PORTABLE    Result Date: 01/25/2022  Title: Chest Radiographs Indication:  Shortness of breath and wheezing. Comparison: None. Findings:  Lungs/pleura:No evidence of consolidation or effusion Vascularity:Normal Vasculature Cardiac:Normal size cardiac silhouette Mediastinum:Normal Bones:No obvious acute fracture     No acute process          Signed:  LJacqlyn Krauss MD    Part of this note may have been written by using a voice dictation software.  The note has been proof read but may still contain some grammatical/other typographical errors.

## 2022-01-25 NOTE — Progress Notes (Signed)
TRANSFER - IN REPORT:    Verbal report received from [place], RN on Ann Medina  being received from ED for routine progression of patient care      Report consisted of patient's Situation, Background, Assessment and   Recommendations(SBAR).     Information from the following report(s) ED Encounter Summary, ED SBAR, and MAR was reviewed with the receiving nurse.    Opportunity for questions and clarification was provided.      Assessment completed upon patient's arrival to unit and care assumed.

## 2022-01-25 NOTE — Consults (Signed)
PULMONARY/CRITICAL CARE CONSULT NOTE           01/25/2022    Ann Medina                        Date of Admission:  01/25/2022    The patient's chart is reviewed and the patient is discussed with the staff.    Subjective:     Patient is a 21 y.o. African American female seen and evaluated at the request of Dr. Jerelene Redden- Pachter for the evaluation of asthma exacerbation.She lost insurance and ran out of meds. Used to be followed in [place]. Has multiple exacerbations a year. Usually on Adviar and Spiriva.Since yesterday increased cough, wheezing ,SOB  Admitted, feeling little better      Review of Systems: Comprehensive ROS negative except in HPI    Current Outpatient Medications   Medication Instructions    albuterol sulfate HFA (PROVENTIL HFA) 108 (90 Base) MCG/ACT inhaler 2 puffs, Inhalation, EVERY 6 HOURS PRN    budesonide (PULMICORT) 500 mcg    budesonide-formoterol (SYMBICORT) 80-4.5 MCG/ACT AERO Inhale into the lungs    cetirizine (ZYRTEC) 10 mg, Oral, DAILY    fluticasone (FLONASE) 50 MCG/ACT nasal spray 2 sprays    fluticasone-salmeterol (ADVAIR DISKUS) 250-50 MCG/ACT AEPB diskus inhaler 1 puff, Inhalation, EVERY 12 HOURS    levalbuterol (XOPENEX) 0.31 mg, EVERY 4 HOURS PRN    montelukast (SINGULAIR) 10 mg, Oral, DAILY    tiotropium (SPIRIVA) 18 MCG inhalation capsule 1 capsule, Inhalation, DAILY      Past Medical History:   Diagnosis Date    Allergic rhinitis     Aspergillosis (HCC)     Asthma     Asthma     GERD (gastroesophageal reflux disease)     Molluscum contagiosum      No past surgical history on file.  Social History     Socioeconomic History    Marital status: Single     Spouse name: Not on file    Number of children: Not on file    Years of education: Not on file    Highest education level: Not on file   Occupational History    Not on file   Tobacco Use    Smoking status: Never    Smokeless tobacco: Not on file   Substance and Sexual Activity    Alcohol use: No    Drug use: No     Sexual activity: Not on file   Other Topics Concern    Not on file   Social History Narrative    Not on file     Social Determinants of Health     Financial Resource Strain: Not on file   Food Insecurity: Not on file   Transportation Needs: Not on file   Physical Activity: Not on file   Stress: Not on file   Social Connections: Not on file   Intimate Partner Violence: Not on file   Housing Stability: Not on file     Family History   Problem Relation Age of Onset    Other Other         CEREBROVASCULAR ACCIDENT    Hypertension Other     Coronary Art Dis Other     Diabetes Other      Allergies   Allergen Reactions    Peanut Oil Other (See Comments)    Montelukast Rash     States not allergic to this med  Objective:   Blood pressure 126/84, pulse (!) 105, temperature 98.4 F (36.9 C), temperature source Oral, resp. rate 18, height 5\' 8"  (1.727 m), weight 230 lb (104.3 kg), SpO2 95 %. No intake or output data in the 24 hours ending 01/25/22 1250  PHYSICAL EXAM   Constitutional:  the patient is well developed and in no acute distress  EENMT:  Sclera clear, pupils equal, oral mucosa moist  Respiratory: symmetric chest rise. wheezing  Cardiovascular:  RRR without M,G,R. There is no lower extremity edema.  Gastrointestinal: soft and non-tender; with positive bowel sounds.  Musculoskeletal: warm without cyanosis. Normal muscle tone.   Skin:  no jaundice or rashes, no wounds   Neurologic: symmetric strength, fluent speech  Psychiatric:  calm, appropriate, oriented x 4    Imaging: I performed an independent interpretation of the patient's images.  CXR:  8/8  -clear        Recent Labs     01/25/22  0737   WBC 12.5*   HGB 13.8   HCT 42.5   PLT 405   NA 139   K 4.2   CL 111*   CO2 23   BUN 10   CREATININE 0.93   BILITOT 0.3   AST 47*   ALT 65   ALKPHOS 134     ECHO: No results found for this or any previous visit.    MICRO: No results for input(s): CULTURE in the last 72 hours.  Assessment and Plan:  (Medical Decision Making)    Principal Problem:    Acute asthma exacerbation  Plan: agree with steroids, nebs  Would be candidate for biologics , check IgE absolute eos count   -will need follow up,   -starting a new job with insurance  Active Problems:    GERD (gastroesophageal reflux disease)  Plan: on PPI as needed    Allergic rhinitis  Plan: on flonase, Singulair, Astelin and zyrtec       Full Code    More than 50% of the time documented was spent in face-to-face contact with the patient and in the care of the patient on the floor/unit where the patient is located.    Thank you very much for this referral.  We appreciate the opportunity to participate in this patient's care.  Will follow along with above stated plan.    03/27/22, MD

## 2022-01-25 NOTE — ED Notes (Signed)
TRANSFER - OUT REPORT:    Verbal report given to [place], RN on [employer]  being transferred to 340 for urgent transfer       Report consisted of patient's Situation, Background, Assessment and   Recommendations(SBAR).     Information from the following report(s) Nurse Handoff Report, Index, ED Encounter Summary, ED SBAR, Adult Overview, Surgery Report, MAR, Recent Results, and Med Rec Status was reviewed with the receiving nurse.    Kinder Fall Assessment:    Presents to emergency department  because of falls (Syncope, seizure, or loss of consciousness): No  Age > 70: No  Altered Mental Status, Intoxication with alcohol or substance confusion (Disorientation, impaired judgment, poor safety awaremess, or inability to follow instructions): No  Impaired Mobility: Ambulates or transfers with assistive devices or assistance; Unable to ambulate or transer.: No  Nursing Judgement: No          Lines:   Peripheral IV 01/25/22 Right Antecubital (Active)        Opportunity for questions and clarification was provided.      Patient transported with:  Transport           Merlene Pulling, RN  01/25/22 [phone]

## 2022-01-25 NOTE — Progress Notes (Signed)
Re checked patient, feeling better, trying to sleep, on 2 L NC with adequate sats  Jacqlyn Larsen, MD

## 2022-01-25 NOTE — ED Provider Notes (Signed)
Emergency Department Provider Note       PCP: On File Not (Inactive)   Age: 21 y.o.   Sex: female     DISPOSITION Decision To Admit 01/25/2022 09:58:54 AM       ICD-10-CM    1. Shortness of breath  R06.02       2. Acute exacerbation of extrinsic asthma  J45.901           Medical Decision Making   Shortness of breath bronchospasm.  Suspect exacerbation of patient's asthma.  Will get chest x-ray to check for pneumothorax or effusion.  Check EKG.  Needs IV steroids, nebulizer treatments.  IV magnesium.  Complexity of Problems Addressed:  1 or more acute illnesses that pose a threat to life or bodily function.     Data Reviewed and Analyzed:  Category 1:   I independently ordered and reviewed each unique test.  I reviewed external records: ED visit note from an outside group.  I reviewed external records: provider visit note from outside specialist.   Numerous emergency department notes at another hospital where the patient is treated for reactive airway disease, usually with nebulizer and steroids.  There also is a discharge summary from that admission in another hospital 1 year ago where patient had to have IV magnesium.  Was admitted for more than 24 hours.  Does have pulmonologist.    Category 2:   I independently ordered and interpreted the ED EKG in the absence of a Cardiologist.    Rate: 109  EKG Interpretation: EKG Interpretation: sinus rhythm, no evidence of arrhythmia  ST Segments: Normal ST segments - NO STEMI  I interpreted the X-rays agree with radiologist.  No infiltrate.    Category 3: Discussion of management or test interpretation.  10:02 AM  After continuous neb treatment, resting more comfortably.  Still significant wheezing but heart rate down to 90 and saturation 98% on oxygen.  Only marginally better after repeated nebulizer treatments, supplemental oxygen, IV steroids and magnesium infusion.  10:02 AM  Patient ambulated to bathroom.  But came more acutely short of breath.  O2 saturation in the  lower 90s.  Heart rate went up.  We will get another breathing treatment but I suspect patient will need to be admitted for observation as she is attempted nebulizer treatments at home without much improvement over the last 12 hours  The patient was admitted and I have discussed patient management with the admitting provider.    Risk of Complications and/or Morbidity of Patient Management:  Chronic medical problems impacting care include reactive airway disease which will cause intermittent issues with shortness of breath.     ===================================================================  This patient is critically ill and there is a high probability of of imminent or life threatening deterioration in the patient's condition without immediate management.    The nature of the patient's clinical problem is: Exacerbation of asthma requiring repeated nebulizer treatments, IV steroids, magnesium infusion, supplemental oxygen    I have spent 45 minutes in direct patient care, documentation, review of labs/xrays/old records, discussion with Colleague .     The time involved in the performance of separately reportable procedures was not counted toward critical care time.     Dorothyann Peng, MD; 01/25/2022 '@10' :02 AM  ===================================================================          Is this patient to be included in the SEP-1 core measure due to severe sepsis or septic shock? No Exclusion criteria - the patient is NOT to be included  for SEP-1 Core Measure due to: Infection is not suspected      History      Ann Medina is a 20 y.o. female who presents to the Emergency Department with chief complaint of    Chief Complaint   Patient presents with    Shortness of Breath      21 year old female difficulty giving history due to shortness of breath and wheezing.  Has a history reactive airway disease started up last night with increasing wheezing and shortness of breath.  Did not improve with home nebulizer  treatments.  Tightness in her chest.  No fever chills.  Is coughing.  No sputum.  No swelling in legs or leg pain.  No history of DVT or PE.    The history is provided by the patient.      Review of Systems   Unable to perform ROS: Severe respiratory distress     Physical Exam     Vitals signs and nursing note reviewed:  Vitals:    01/25/22 0820 01/25/22 0900 01/25/22 0909 01/25/22 0936   BP: (!) 124/94 (!) 137/95     Pulse: (!) 112 94 93 76   Resp: 21 24     Temp:       TempSrc:       SpO2: 100% 99% 99% 100%   Weight:       Height:          Physical Exam  Vitals and nursing note reviewed.   Constitutional:       General: She is in acute distress.      Appearance: She is ill-appearing.   HENT:      Head: Normocephalic and atraumatic.   Eyes:      General: No scleral icterus.     Conjunctiva/sclera: Conjunctivae normal.   Cardiovascular:      Rate and Rhythm: Regular rhythm. Tachycardia present.   Pulmonary:      Breath sounds: Decreased breath sounds and wheezing present.   Musculoskeletal:      Right lower leg: No tenderness. No edema.      Left lower leg: No tenderness. No edema.   Skin:     General: Skin is warm.   Neurological:      Mental Status: She is alert.        Procedures     Procedures    Orders Placed This Encounter   Procedures    XR CHEST PORTABLE    CBC with Auto Differential    Comprehensive Metabolic Panel    Nebulizer continuous    Nebulizer continuous    EKG 12 Lead    Insert peripheral IV        Medications   albuterol (PROVENTIL) (2.5 MG/3ML) 0.083% nebulizer solution (has no administration in time range)   predniSONE (DELTASONE) tablet 40 mg (has no administration in time range)   ipratropium 0.5 mg-albuterol 2.5 mg (DUONEB) nebulizer solution 1 Dose (has no administration in time range)   dexamethasone (DECADRON) injection 10 mg (10 mg IntraVENous Given 01/25/22 0756)   albuterol (PROVENTIL) (2.5 MG/3ML) 0.083% nebulizer solution (15 mg/hr Nebulization Given 01/25/22 0750)   magnesium sulfate  2000 mg in 50 mL IVPB premix (0 mg IntraVENous Stopped 01/25/22 0859)   ipratropium (ATROVENT) 0.02 % nebulizer solution 0.5 mg (0.5 mg Nebulization Given 01/25/22 0750)   albuterol (PROVENTIL) (2.5 MG/3ML) 0.083% nebulizer solution 10 mg (10 mg Nebulization Given 01/25/22 0810)       New Prescriptions  No medications on file        Past Medical History:   Diagnosis Date    Allergic rhinitis     Aspergillosis (HCC)     Asthma     Asthma     GERD (gastroesophageal reflux disease)     Molluscum contagiosum         No past surgical history on file.     Social History     Socioeconomic History    Marital status: Single   Tobacco Use    Smoking status: Never   Substance and Sexual Activity    Alcohol use: No    Drug use: No        Previous Medications    ALBUTEROL SULFATE HFA (PROVENTIL HFA) 108 (90 BASE) MCG/ACT INHALER    Inhale 2 puffs into the lungs every 6 hours as needed for Wheezing    BUDESONIDE (PULMICORT) 0.5 MG/2ML NEBULIZER SUSPENSION    Inhale 500 mcg into the lungs    BUDESONIDE-FORMOTEROL (SYMBICORT) 80-4.5 MCG/ACT AERO    Inhale into the lungs    FLUTICASONE (FLONASE) 50 MCG/ACT NASAL SPRAY    2 sprays by Nasal route    LEVALBUTEROL (XOPENEX) 0.31 MG/3ML NEBULIZATION    Inhale 0.31 mg into the lungs every 4 hours as needed    MONTELUKAST (SINGULAIR) 10 MG TABLET    Take 10 mg by mouth daily    TIOTROPIUM (SPIRIVA) 18 MCG INHALATION CAPSULE    Inhale 1 capsule into the lungs daily        Results for orders placed or performed during the hospital encounter of 01/25/22   XR CHEST PORTABLE    Narrative    Title: Chest Radiographs    Indication:  Shortness of breath and wheezing.    Comparison: None.    Findings:    Lungs/pleura:No evidence of consolidation or effusion  Vascularity:Normal Vasculature  Cardiac:Normal size cardiac silhouette  Mediastinum:Normal  Bones:No obvious acute fracture      Impression    No acute process     CBC with Auto Differential   Result Value Ref Range    WBC 12.5 (H) 4.3 - 11.1 K/uL     RBC 4.45 4.05 - 5.2 M/uL    Hemoglobin 13.8 11.7 - 15.4 g/dL    Hematocrit 42.5 35.8 - 46.3 %    MCV 95.5 82.0 - 102.0 FL    MCH 31.0 26.1 - 32.9 PG    MCHC 32.5 31.4 - 35.0 g/dL    RDW 12.3 11.9 - 14.6 %    Platelets 405 150 - 450 K/uL    MPV 10.1 9.4 - 12.3 FL    nRBC 0.00 0.0 - 0.2 K/uL    Differential Type AUTOMATED      Neutrophils % 51 43 - 78 %    Lymphocytes % 36 13 - 44 %    Monocytes % 8 4.0 - 12.0 %    Eosinophils % 4 0.5 - 7.8 %    Basophils % 1 0.0 - 2.0 %    Immature Granulocytes 0 0.0 - 5.0 %    Neutrophils Absolute 6.4 1.7 - 8.2 K/UL    Lymphocytes Absolute 4.5 0.5 - 4.6 K/UL    Monocytes Absolute 0.9 0.1 - 1.3 K/UL    Eosinophils Absolute 0.5 0.0 - 0.8 K/UL    Basophils Absolute 0.1 0.0 - 0.2 K/UL    Absolute Immature Granulocyte 0.0 0.0 - 0.5 K/UL   Comprehensive Metabolic Panel   Result  Value Ref Range    Sodium 139 133 - 143 mmol/L    Potassium 4.2 3.5 - 5.1 mmol/L    Chloride 111 (H) 101 - 110 mmol/L    CO2 23 21 - 32 mmol/L    Anion Gap 5 2 - 11 mmol/L    Glucose 103 (H) 65 - 100 mg/dL    BUN 10 6 - 23 MG/DL    Creatinine 0.93 0.6 - 1.0 MG/DL    Est, Glom Filt Rate >60 >60 ml/min/1.32m    Calcium 8.8 8.3 - 10.4 MG/DL    Total Bilirubin 0.3 0.2 - 1.1 MG/DL    ALT 65 12 - 65 U/L    AST 47 (H) 15 - 37 U/L    Alk Phosphatase 134 50 - 136 U/L    Total Protein 7.7 6.3 - 8.2 g/dL    Albumin 3.5 3.5 - 5.0 g/dL    Globulin 4.2 2.8 - 4.5 g/dL    Albumin/Globulin Ratio 0.8 0.4 - 1.6     EKG 12 Lead   Result Value Ref Range    Ventricular Rate 110 BPM    Atrial Rate 109 BPM    P-R Interval 136 ms    QRS Duration 87 ms    Q-T Interval 322 ms    QTc Calculation (Bazett) 436 ms    P Axis 75 degrees    R Axis 18 degrees    T Axis 45 degrees    Diagnosis       Sinus tachycardia  RAE, consider biatrial enlargement  Baseline wander in lead(s) I II aVR          XR CHEST PORTABLE   Final Result   No acute process                          Voice dictation software was used during the making of this note.  This  software is not perfect and grammatical and other typographical errors may be present.  This note has not been completely proofread for errors.     WDorothyann Peng MD  01/25/22 1003

## 2022-01-25 NOTE — ED Triage Notes (Signed)
Patient presents to the ER c\o shortness of breath. Patient has audibile wheezing and is in a tripod position

## 2022-01-25 NOTE — Progress Notes (Signed)
Patient complaining of chest pressure and pain that radiates to her back. VS stable. Dr. Sheran Luz notified and orders received.

## 2022-01-25 NOTE — Progress Notes (Signed)
Per RT, mother states she has vocal cord dysfunction  Jacqlyn Larsen, MD

## 2022-01-26 ENCOUNTER — Inpatient Hospital Stay: Admit: 2022-01-26 | Primary: Sports Medicine

## 2022-01-26 ENCOUNTER — Inpatient Hospital Stay: Admit: 2022-01-26 | Discharge: 2022-02-16 | Primary: Sports Medicine

## 2022-01-26 DIAGNOSIS — R0602 Shortness of breath: Secondary | ICD-10-CM

## 2022-01-26 LAB — ECHO (TTE) COMPLETE (PRN CONTRAST/BUBBLE/STRAIN/3D)
AV Area by Peak Velocity: 1.9 cm2
AV Area by VTI: 2 cm2
AV Mean Gradient: 7 mmHg
AV Mean Velocity: 1.3 m/s
AV Peak Gradient: 15 mmHg
AV Peak Velocity: 2 m/s
AV VTI: 36 cm
AV Velocity Ratio: 0.6
AVA/BSA Peak Velocity: 0.9 cm2/m2
AVA/BSA VTI: 0.9 cm2/m2
Ao Root Index: 1.2 cm/m2
Aortic Root: 2.6 cm
Ascending Aorta Index: 1.24 cm/m2
Ascending Aorta: 2.7 cm
Body Surface Area: 2.24 m2
E/E' Lateral: 7.14
E/E' Ratio (Averaged): 7.42
E/E' Septal: 7.69
EF BP: 63 % (ref 55–100)
Est. RA Pressure: 3 mmHg
Fractional Shortening 2D: 39 % (ref 28–44)
IVC Expiration: 1.5 cm
IVSd: 1.1 cm — AB (ref 0.6–0.9)
LA Area 2C: 18.2 cm2
LA Area 4C: 19.1 cm2
LA Diameter: 3.3 cm
LA Major Axis: 6 cm
LA Minor Axis: 5.4 cm
LA Size Index: 1.52 cm/m2
LA Volume A-L A4C: 49 mL (ref 22–52)
LA Volume A-L A4C: 50 mL (ref 22–52)
LA Volume BP: 52 mL (ref 22–52)
LA Volume Index A-L A2C: 23 mL/m2 (ref 16–34)
LA Volume Index A-L A4C: 23 mL/m2 (ref 16–34)
LA Volume Index BP: 24 ml/m2 (ref 16–34)
LA/AO Root Ratio: 1.27
LV E' Lateral Velocity: 14 cm/s
LV E' Septal Velocity: 13 cm/s
LV EDV A2C: 106 mL
LV EDV A4C: 91 mL
LV EDV Index A2C: 49 mL/m2
LV EDV Index A4C: 42 mL/m2
LV ESV A2C: 39 mL
LV ESV A4C: 32 mL
LV ESV Index A2C: 18 mL/m2
LV ESV Index A4C: 15 mL/m2
LV Ejection Fraction A2C: 64 %
LV Ejection Fraction A4C: 65 %
LV Mass 2D Index: 68.1 g/m2 (ref 43–95)
LV Mass 2D: 147.8 g (ref 67–162)
LV RWT Ratio: 0.41
LVIDd Index: 2.03 cm/m2
LVIDd: 4.4 cm (ref 3.9–5.3)
LVIDs Index: 1.24 cm/m2
LVIDs: 2.7 cm
LVOT Area: 3.1 cm2
LVOT Diameter: 2 cm
LVOT Mean Gradient: 3 mmHg
LVOT Peak Gradient: 6 mmHg
LVOT Peak Velocity: 1.2 m/s
LVOT SV: 71.3 ml
LVOT Stroke Volume Index: 32.8 mL/m2
LVOT VTI: 22.7 cm
LVOT:AV VTI Index: 0.63
LVPWd: 0.9 cm (ref 0.6–0.9)
MV A Velocity: 0.65 m/s
MV E Velocity: 1 m/s
MV E Wave Deceleration Time: 161 ms
MV E/A: 1.54
PV AT: 116 ms
PV Max Velocity: 1.2 m/s
PV Peak Gradient: 6 mmHg
RV Basal Dimension: 2.7 cm
RV Free Wall Peak S': 16 cm/s
TAPSE: 2 cm (ref 1.7–?)

## 2022-01-26 LAB — BASIC METABOLIC PANEL W/ REFLEX TO MG FOR LOW K
Anion Gap: 9 mmol/L (ref 2–11)
BUN: 13 MG/DL (ref 6–23)
CO2: 20 mmol/L — ABNORMAL LOW (ref 21–32)
Calcium: 9 MG/DL (ref 8.3–10.4)
Chloride: 108 mmol/L (ref 101–110)
Creatinine: 1.08 MG/DL — ABNORMAL HIGH (ref 0.6–1.0)
Est, Glom Filt Rate: >60 mL/min/{1.73_m2} (ref >60)
Glucose: 252 mg/dL — ABNORMAL HIGH (ref 65–100)
Potassium: 4.7 mmol/L (ref 3.5–5.1)
Sodium: 137 mmol/L (ref 133–143)

## 2022-01-26 LAB — CBC WITH AUTO DIFFERENTIAL
Absolute Immature Granulocyte: 0.1 10*3/uL (ref 0.0–0.5)
Basophils %: 0 % (ref 0.0–2.0)
Basophils Absolute: 0 10*3/uL (ref 0.0–0.2)
Eosinophils %: 0 % — ABNORMAL LOW (ref 0.5–7.8)
Eosinophils Absolute: 0 10*3/uL (ref 0.0–0.8)
Hematocrit: 39.1 % (ref 35.8–46.3)
Hemoglobin: 12.7 g/dL (ref 11.7–15.4)
Immature Granulocytes: 1 % (ref 0.0–5.0)
Lymphocytes %: 7 % — ABNORMAL LOW (ref 13–44)
Lymphocytes Absolute: 1.2 10*3/uL (ref 0.5–4.6)
MCH: 30.8 PG (ref 26.1–32.9)
MCHC: 32.5 g/dL (ref 31.4–35.0)
MCV: 94.9 FL (ref 82.0–102.0)
MPV: 10.5 FL (ref 9.4–12.3)
Monocytes %: 3 % — ABNORMAL LOW (ref 4.0–12.0)
Monocytes Absolute: 0.5 10*3/uL (ref 0.1–1.3)
Neutrophils %: 90 % — ABNORMAL HIGH (ref 43–78)
Neutrophils Absolute: 15.7 10*3/uL — ABNORMAL HIGH (ref 1.7–8.2)
Platelets: 379 10*3/uL (ref 150–450)
RBC: 4.12 M/uL (ref 4.05–5.2)
RDW: 12.6 % (ref 11.9–14.6)
WBC: 17.5 10*3/uL — ABNORMAL HIGH (ref 4.3–11.1)
nRBC: 0 10*3/uL (ref 0.0–0.2)

## 2022-01-26 LAB — POCT GLUCOSE
POC Glucose: 259 mg/dL — ABNORMAL HIGH (ref 65–100)
POC Glucose: 227 mg/dL — ABNORMAL HIGH (ref 65–100)

## 2022-01-26 LAB — EKG 12-LEAD
Atrial Rate: 97 {beats}/min
Atrial Rate: 92 {beats}/min
Diagnosis: NORMAL
P Axis: 61 degrees
P Axis: 63 degrees
P-R Interval: 142 ms
P-R Interval: 138 ms
Q-T Interval: 344 ms
Q-T Interval: 344 ms
QRS Duration: 86 ms
QRS Duration: 68 ms
QTc Calculation (Bazett): 436 ms
QTc Calculation (Bazett): 425 ms
R Axis: 17 degrees
R Axis: 3 degrees
T Axis: 17 degrees
T Axis: 1 degrees
Ventricular Rate: 97 {beats}/min
Ventricular Rate: 92 {beats}/min

## 2022-01-26 LAB — D-DIMER, QUANTITATIVE: D-Dimer, Quant: <0.27 ug/ml(FEU) (ref <0.56)

## 2022-01-26 LAB — TROPONIN
Troponin, High Sensitivity: <3 pg/mL (ref 0–14)
Troponin, High Sensitivity: <3 pg/mL (ref 0–14)
Troponin, High Sensitivity: 4.7 pg/mL (ref 0–14)
Troponin, High Sensitivity: 3.4 pg/mL (ref 0–14)

## 2022-01-26 LAB — HEPATIC FUNCTION PANEL
ALT: 69 U/L — ABNORMAL HIGH (ref 12–65)
AST: 40 U/L — ABNORMAL HIGH (ref 15–37)
Albumin/Globulin Ratio: 0.8 (ref 0.4–1.6)
Albumin: 3.4 g/dL — ABNORMAL LOW (ref 3.5–5.0)
Alk Phosphatase: 127 U/L (ref 50–136)
Bilirubin, Direct: 0.1 MG/DL (ref <0.4)
Globulin: 4.1 g/dL (ref 2.8–4.5)
Total Bilirubin: 0.2 MG/DL (ref 0.2–1.1)
Total Protein: 7.5 g/dL (ref 6.3–8.2)

## 2022-01-26 MED ORDER — IOPAMIDOL 76 % IV SOLN
76 % | Freq: Once | INTRAVENOUS | Status: AC | PRN
Start: 2022-01-26 — End: 2022-01-26
  Administered 2022-01-26: 15:00:00 100 mL via INTRAVENOUS

## 2022-01-26 MED ORDER — INSULIN LISPRO 100 UNIT/ML IJ SOLN
100 UNIT/ML | Freq: Every evening | INTRAMUSCULAR | Status: AC
Start: 2022-01-26 — End: 2022-01-29
  Administered 2022-01-27 – 2022-01-29 (×2): 4 [IU] via SUBCUTANEOUS

## 2022-01-26 MED ORDER — STERILE WATER FOR INJECTION (MIXTURES ONLY)
125 MG | Freq: Two times a day (BID) | INTRAMUSCULAR | Status: AC
Start: 2022-01-26 — End: 2022-01-27
  Administered 2022-01-26 – 2022-01-27 (×2): 60 mg via INTRAVENOUS

## 2022-01-26 MED ORDER — INSULIN LISPRO 100 UNIT/ML IJ SOLN
100 UNIT/ML | Freq: Three times a day (TID) | INTRAMUSCULAR | Status: AC
Start: 2022-01-26 — End: 2022-01-29
  Administered 2022-01-26: 22:00:00 1 [IU] via SUBCUTANEOUS
  Administered 2022-01-26 – 2022-01-27 (×3): 2 [IU] via SUBCUTANEOUS
  Administered 2022-01-27: 16:00:00 4 [IU] via SUBCUTANEOUS
  Administered 2022-01-28: 16:00:00 3 [IU] via SUBCUTANEOUS
  Administered 2022-01-28 (×2): 2 [IU] via SUBCUTANEOUS

## 2022-01-26 MED ORDER — MORPHINE SULFATE 2 MG/ML IJ SOLN
2 MG/ML | Freq: Once | INTRAMUSCULAR | Status: AC
Start: 2022-01-26 — End: 2022-01-29

## 2022-01-26 MED ORDER — TRAMADOL HCL 50 MG PO TABS
50 MG | Freq: Four times a day (QID) | ORAL | Status: AC | PRN
Start: 2022-01-26 — End: 2022-01-27
  Administered 2022-01-26: 14:00:00 25 mg via ORAL

## 2022-01-26 MED ORDER — NORMAL SALINE FLUSH 0.9 % IV SOLN
0.9 % | Freq: Once | INTRAVENOUS | Status: AC | PRN
Start: 2022-01-26 — End: 2022-01-26
  Administered 2022-01-26: 15:00:00 10 mL via INTRAVENOUS

## 2022-01-26 MED ORDER — IBUPROFEN 400 MG PO TABS
400 MG | Freq: Once | ORAL | Status: AC
Start: 2022-01-26 — End: 2022-01-25
  Administered 2022-01-26: 02:00:00 400 mg via ORAL

## 2022-01-26 MED ORDER — SODIUM CHLORIDE 0.9 % IV BOLUS
0.9 % | Freq: Once | INTRAVENOUS | Status: AC | PRN
Start: 2022-01-26 — End: 2022-01-26
  Administered 2022-01-26: 15:00:00 100 mL via INTRAVENOUS

## 2022-01-26 MED FILL — PANTOPRAZOLE SODIUM 40 MG PO TBEC: 40 MG | ORAL | Qty: 1

## 2022-01-26 MED FILL — BUDESONIDE 0.5 MG/2ML IN SUSP: 0.5 MG/2ML | RESPIRATORY_TRACT | Qty: 2

## 2022-01-26 MED FILL — ALBUTEROL SULFATE (2.5 MG/3ML) 0.083% IN NEBU: RESPIRATORY_TRACT | Qty: 3

## 2022-01-26 MED FILL — HUMALOG 100 UNIT/ML IJ SOLN: 100 UNIT/ML | INTRAMUSCULAR | Qty: 1

## 2022-01-26 MED FILL — IBUPROFEN 400 MG PO TABS: 400 MG | ORAL | Qty: 1

## 2022-01-26 MED FILL — METHYLPREDNISOLONE SODIUM SUCC 125 MG IJ SOLR: 125 MG | INTRAMUSCULAR | Qty: 125

## 2022-01-26 MED FILL — CETIRIZINE HCL 10 MG PO TABS: 10 MG | ORAL | Qty: 1

## 2022-01-26 MED FILL — TRAMADOL HCL 50 MG PO TABS: 50 MG | ORAL | Qty: 1

## 2022-01-26 MED FILL — HUMALOG 100 UNIT/ML IJ SOLN: 100 UNIT/ML | INTRAMUSCULAR | Qty: 2

## 2022-01-26 MED FILL — MONTELUKAST SODIUM 10 MG PO TABS: 10 MG | ORAL | Qty: 1

## 2022-01-26 NOTE — Consults (Addendum)
[facility] Cardiology Initial Cardiac Evaluation      Date of  Admission: 01/25/2022  7:25 AM     Primary Care Physician: On File Not (Inactive)  Primary Cardiologist: None  Referring Physician: Dr Mel Almond  Consulting Physician: Dr Doreene Adas    CC/Reason for evaluation: atypical chest pain, negative troponin     HPI:  Ann Medina is a 21 y.o. female with prior history of asthma and GERD who presented to [facility] with complaint of shortness of breath and wheezing. Patient had lapse in health insurance and ran out of her asthma medications reportedly prior to admission. Had 1-2 day history of increased shortness of breath and wheezing. Admitted with acute asthma exacerbation. During admission patient complained of chest pain/pressure with radiation to her back therefore cardiology consulted.  Patient reports pain is sharp in nature, located anteriorly as well as under the left breast at times to the back.  Worse with touching in the area of pain.  Also worse with deep inspiration.  No other aggravating factors identified.  Pain is mild to moderate.  Pain is improved.  Denies fevers, chills, abdominal pain, nausea, vomiting, leg swelling, difficulty moving arms or legs, vision changes, rashes or color changes in her skin.  Continues to have shortness of breath and wheezing.  No other complaints at this time.  No other alleviating or aggravating factors identified.  No other associated symptoms.      Past Medical History:   Diagnosis Date    Allergic rhinitis     Aspergillosis (HCC)     Asthma     Asthma     GERD (gastroesophageal reflux disease)     Molluscum contagiosum       No past surgical history on file.    Allergies   Allergen Reactions    Peanut Oil Other (See Comments)    Montelukast Rash     States not allergic to this med      Social History     Socioeconomic History    Marital status: Single     Spouse name: Not on file    Number of children: Not on file    Years of education: Not on file    Highest education  level: Not on file   Occupational History    Not on file   Tobacco Use    Smoking status: Never    Smokeless tobacco: Not on file   Substance and Sexual Activity    Alcohol use: No    Drug use: No    Sexual activity: Not on file   Other Topics Concern    Not on file   Social History Narrative    Not on file     Social Determinants of Health     Financial Resource Strain: Not on file   Food Insecurity: Not on file   Transportation Needs: Not on file   Physical Activity: Not on file   Stress: Not on file   Social Connections: Not on file   Intimate Partner Violence: Not on file   Housing Stability: Not on file     Social History       Tobacco History       Smoking Status  Never      Smokeless Tobacco Use  Unknown              Alcohol History       Alcohol Use Status  No  Drug Use       Drug Use Status  No              Sexual Activity       Sexually Active  Not Asked                    Family History   Problem Relation Age of Onset    Other Other         CEREBROVASCULAR ACCIDENT    Hypertension Other     Coronary Art Dis Other     Diabetes Other         Current Facility-Administered Medications   Medication Dose Route Frequency    morphine injection 1 mg  1 mg IntraVENous Once    traMADol (ULTRAM) tablet 25 mg  25 mg Oral Q6H PRN    methylPREDNISolone sodium succ (SOLU-MEDROL) 60 mg in sterile water 0.96 mL injection  60 mg IntraVENous Q12H    iopamidol (ISOVUE-370) 76 % injection 100 mL  100 mL IntraVENous ONCE PRN    sodium chloride flush 0.9 % injection 10 mL  10 mL IntraVENous ONCE PRN    sodium chloride 0.9 % bolus 100 mL  100 mL IntraVENous ONCE PRN    albuterol (PROVENTIL) (2.5 MG/3ML) 0.083% nebulizer solution 2.5 mg  2.5 mg Nebulization 4x Daily RT    budesonide (PULMICORT) nebulizer suspension 500 mcg  0.5 mg Nebulization BID RT    sodium chloride flush 0.9 % injection 5-40 mL  5-40 mL IntraVENous 2 times per day    sodium chloride flush 0.9 % injection 5-40 mL  5-40 mL IntraVENous PRN    0.9 %  sodium chloride infusion   IntraVENous PRN    ondansetron (ZOFRAN-ODT) disintegrating tablet 4 mg  4 mg Oral Q8H PRN    Or    ondansetron (ZOFRAN) injection 4 mg  4 mg IntraVENous Q6H PRN    polyethylene glycol (GLYCOLAX) packet 17 g  17 g Oral Daily PRN    acetaminophen (TYLENOL) tablet 650 mg  650 mg Oral Q6H PRN    Or    acetaminophen (TYLENOL) suppository 650 mg  650 mg Rectal Q6H PRN    cetirizine (ZYRTEC) tablet 10 mg  10 mg Oral Daily    montelukast (SINGULAIR) tablet 10 mg  10 mg Oral Nightly    pantoprazole (PROTONIX) tablet 40 mg  40 mg Oral QAM AC    morphine injection 2 mg  2 mg IntraVENous Once       Review of Symptoms:  Review of Systems   Constitutional: Negative. Negative for chills and fever.   Cardiovascular:  Positive for dyspnea on exertion. Negative for chest pain, irregular heartbeat and leg swelling.   Respiratory:  Positive for cough, shortness of breath and wheezing.    Skin: Negative.  Negative for color change and rash.   Gastrointestinal: Negative.    Neurological: Negative.    All other systems reviewed and are negative.     Subjective:     Physical Exam:    Vitals:    01/25/22 2120 01/26/22 0009 01/26/22 0717 01/26/22 0813   BP: (!) 142/86 131/85 126/73    Pulse: (!) 104 91 83 89   Resp: 22 18 18 16    Temp: 98.3 F (36.8 C) 98.1 F (36.7 C) 98 F (36.7 C)    TempSrc: Oral Oral Oral    SpO2: 97% 96% 95% 96%   Weight:       Height:  Physical Exam  Vitals reviewed.   Constitutional:       General: She is not in acute distress.     Appearance: She is obese. She is not toxic-appearing or diaphoretic.   HENT:      Head: Normocephalic and atraumatic.      Right Ear: External ear normal.      Left Ear: External ear normal.      Nose: Nose normal.   Eyes:      General: No scleral icterus.        Right eye: No discharge.         Left eye: No discharge.   Cardiovascular:      Rate and Rhythm: Normal rate and regular rhythm.      Heart sounds: Normal heart sounds. No murmur heard.    No  friction rub. No gallop.   Pulmonary:      Effort: Pulmonary effort is normal. No respiratory distress.      Breath sounds: No stridor. Examination of the left-upper field reveals decreased breath sounds. Examination of the right-middle field reveals wheezing. Examination of the left-middle field reveals wheezing. Examination of the right-lower field reveals wheezing. Decreased breath sounds and wheezing present. No rhonchi or rales.   Abdominal:      General: Abdomen is flat. There is no distension.      Palpations: Abdomen is soft.      Tenderness: There is no abdominal tenderness.   Musculoskeletal:         General: Normal range of motion.      Right lower leg: No edema.      Left lower leg: No edema.   Skin:     General: Skin is warm and dry.   Neurological:      Mental Status: She is alert and oriented to person, place, and time.   Psychiatric:         Mood and Affect: Mood normal.         Thought Content: Thought content normal.         Judgment: Judgment normal.   Labs:     Recent Labs     01/26/22  0607 01/25/22  0737   WBC 17.5* 12.5*   HGB 12.7 13.8   HCT 39.1 42.5   MCV 94.9 95.5   PLT 379 405     Lab Results   Component Value Date    WBC 17.5 (H) 01/26/2022    HGB 12.7 01/26/2022    HCT 39.1 01/26/2022    PLT 379 01/26/2022    ALT 69 (H) 01/26/2022    AST 40 (H) 01/26/2022    NA 137 01/26/2022    K 4.7 01/26/2022    CL 108 01/26/2022    CREATININE 1.08 (H) 01/26/2022    BUN 13 01/26/2022    CO2 20 (L) 01/26/2022      Echocardiogram 01/26/2022:  Left Ventricle Normal left ventricular systolic function. EF by 2D Simpsons Biplane is 63%. Left ventricle size is normal. Mildly increased wall thickness. Normal wall motion. Normal diastolic function. Average E/e' ratio is 7.42.   Left Atrium Left atrial volume index is normal (16-34 mL/m2).   Right Ventricle Right ventricle size is normal. Normal systolic function.   Right Atrium Right atrium size is normal.   Aortic Valve Valve structure is normal. No  regurgitation. No stenosis.   Mitral Valve Valve structure is normal. Mild regurgitation. No stenosis noted.   Tricuspid Valve Not well visualized. Trace regurgitation.  No stenosis noted.   Pulmonic Valve The pulmonic valve was not well visualized. No regurgitation. No stenosis noted.   Aorta Normal sized sinus of Valsalva.   IVC/Hepatic Veins IVC diameter is less than or equal to 21 mm and decreases greater than 50% during inspiration; therefore the estimated right atrial pressure is normal (~3 mmHg).   Pericardium No pericardial effusion.        Assessment/Plan:       Principal Problem:    Acute asthma exacerbation  -Per primary team/pulmonary following       Noncardiac chest pain  -Symptoms seem musculoskeletal in origin.  - Troponin negative on serial checks  - EKG sinus rhythm without evidence of acute ST segment changes concerning for ischemia, injury, infarct pattern.  - Echo as above without focal wall motion abnormalities.  -No indication for further ischemic testing at this time.      GERD (gastroesophageal reflux disease)  - management per primary team       Allergic rhinitis  - management per primary team     Medical decision making as per my assessment and plan above.    Will be on standby.  Please call with questions.    Saunders Glance, DO

## 2022-01-26 NOTE — Progress Notes (Signed)
[facility]  Admission Date: 01/25/2022         Daily Progress Note: 01/26/2022    The patient's chart is reviewed and the patient is discussed with the staff.    Background: Patient is a 21 y.o. African American female seen and evaluated at the request of Dr. Jerelene Redden- Pachter for the evaluation of asthma exacerbation.She lost insurance and ran out of meds. Used to be followed in [place]. Has multiple exacerbations a year. Usually on Adviar and Spiriva.Since yesterday increased cough, wheezing ,SOB  Admitted, feeling little better    Subjective:     On 2L NC  Feels better still some wheezing     Current Facility-Administered Medications   Medication Dose Route Frequency    morphine injection 1 mg  1 mg IntraVENous Once    traMADol (ULTRAM) tablet 25 mg  25 mg Oral Q6H PRN    albuterol (PROVENTIL) (2.5 MG/3ML) 0.083% nebulizer solution 2.5 mg  2.5 mg Nebulization 4x Daily RT    budesonide (PULMICORT) nebulizer suspension 500 mcg  0.5 mg Nebulization BID RT    methylPREDNISolone sodium succ (SOLU-MEDROL) 60 mg in sterile water 0.96 mL injection  60 mg IntraVENous Q6H    sodium chloride flush 0.9 % injection 5-40 mL  5-40 mL IntraVENous 2 times per day    sodium chloride flush 0.9 % injection 5-40 mL  5-40 mL IntraVENous PRN    0.9 % sodium chloride infusion   IntraVENous PRN    ondansetron (ZOFRAN-ODT) disintegrating tablet 4 mg  4 mg Oral Q8H PRN    Or    ondansetron (ZOFRAN) injection 4 mg  4 mg IntraVENous Q6H PRN    polyethylene glycol (GLYCOLAX) packet 17 g  17 g Oral Daily PRN    acetaminophen (TYLENOL) tablet 650 mg  650 mg Oral Q6H PRN    Or    acetaminophen (TYLENOL) suppository 650 mg  650 mg Rectal Q6H PRN    cetirizine (ZYRTEC) tablet 10 mg  10 mg Oral Daily    montelukast (SINGULAIR) tablet 10 mg  10 mg Oral Nightly    pantoprazole (PROTONIX) tablet 40 mg  40 mg Oral QAM AC    morphine injection 2 mg  2 mg IntraVENous Once     Review of Systems: Comprehensive ROS negative except in  HPI  Objective:   Blood pressure 126/73, pulse 89, temperature 98 F (36.7 C), temperature source Oral, resp. rate 16, height 5\' 8"  (1.727 m), weight 230 lb (104.3 kg), SpO2 96 %.   Intake/Output Summary (Last 24 hours) at 01/26/2022 0924  Last data filed at 01/26/2022 03/28/2022  Gross per 24 hour   Intake 340 ml   Output 150 ml   Net 190 ml     Physical Exam:   Constitutional:  the patient is well developed and in no acute distress  EENMT:  Sclera clear, pupils equal, oral mucosa moist  Respiratory: symmetric chest rise. Mid wheezing   Cardiovascular:  RRR without M,G,R. There is no lower extremity edema.  Gastrointestinal: soft and non-tender; with positive bowel sounds.  Musculoskeletal: warm without cyanosis. Normal muscle tone.   Skin:  no jaundice or rashes, no wounds   Neurologic: symmetric strength, fluent speech  Psychiatric:  calm, appropriate, oriented x 4    Imaging: I performed an independent interpretation of the patient's images.  CXR:   8/8  clear         LAB:  Recent Labs     01/25/22  0981 01/26/22  0607   WBC 12.5* 17.5*   HGB 13.8 12.7   HCT 42.5 39.1   PLT 405 379     Recent Labs     01/25/22  0737 01/26/22  0607   NA 139 137   K 4.2 4.7   CL 111* 108   CO2 23 20*   BUN 10 13   CREATININE 0.93 1.08*   BILITOT 0.3 0.2   AST 47* 40*   ALT 65 69*   ALKPHOS 134 127     Recent Labs     01/25/22  2137 01/26/22  0144 01/26/22  0607   TROPHS <3.0 <3.0 4.7     Recent Labs     01/25/22  0737 01/26/22  0607   GLUCOSE 103* 252*      Microbiology:   No results for input(s): CULTURE in the last 72 hours.  ECHO: No results found for this or any previous visit.    Assessment and Plan:  (Medical Decision Making)     2 y old female admitted with asthma exacerbation ,after she ran out of her medicine, having multiple exacerbations yearly     Principal Problem:    Acute asthma exacerbation  Plan:   Patient has severe persistent asthma and would benefit from biologics- based on elevated eos count she qualified   Continue  current regimen, will wean steroids some, will need outpatient follow up -she used to follow with [facility]   Active Problems:    GERD (gastroesophageal reflux disease)  Plan:     Allergic rhinitis  Plan:       More than 50% of the time documented was spent in face-to-face contact with the patient and in the care of the patient on the floor/unit where the patient is located.    Anitra Lauth, MD

## 2022-01-26 NOTE — Progress Notes (Signed)
Physician Progress Note      PATIENT:               Ann Medina, Ann Medina  CSN #:                  [id number]  DOB:                       07/16/2000  ADMIT DATE:       01/25/2022 7:25 AM  DISCH DATE:  RESPONDING  PROVIDER #:        Verdis Prime MD          QUERY TEXT:    Patient admitted with asthma exacerbation. Noted documentation of acute   respiratory failure in 8/9 progress note. In order to support the diagnosis of   acute respiratory failure, please include additional clinical indicators in   your documentation.  Or please document if the diagnosis of acute respiratory   failure has been ruled out after further study.    The medical record reflects the following:  Risk Factors: 21 y.o. female with medical history of asthma, allergies, GERD   who is evaluated with shortness of breath/ wheezing.  Clinical Indicators: 8/8 progress note "Lungs: Diffuse expiratory wheezing"  RR 15-25, ABG:  pH: 7.48, pC02: 28.4, p02: 90, Documented 02 sat of 88%  Treatment: 2LNC    Acute Respiratory Failure Clinical Indicators per 48M MS-DRG Training Guide and   Quick Reference Guide:  pO2 < 60 mmHg  pCO2 > 50 and pH < 7.35  P/F ratio (pO2 / FIO2) < 300  pO2 decrease or pCO2 increase by 10 mmHg from baseline (if known)  Supplemental oxygen of 40% or more  Presence of respiratory distress, tachypnea, dyspnea, shortness of breath,   wheezing  Unable to speak in complete sentences  Use of accessory muscles to breathe  Extreme anxiety and feeling of impending doom  Tripod position  Confusion/altered mental status/obtunded    Thank you,  Human resources officer, BSN, CDI, [email or web address]  .  Options provided:  -- Acute Respiratory Failure as evidenced by, Please document evidence.  -- Acute Respiratory Failure ruled out after study  -- Other - I will add my own diagnosis  -- Disagree - Not applicable / Not valid  -- Disagree - Clinically unable to determine / Unknown  -- Refer to Clinical Documentation Reviewer    PROVIDER  RESPONSE TEXT:    This patient is in acute respiratory failure as evidenced by severe persistent   asthma    Query created by: Edgardo Roys on 01/26/2022 12:51 PM      Electronically signed by:  Verdis Prime MD 01/26/2022 12:56 PM

## 2022-01-26 NOTE — Care Coordination-Inpatient (Signed)
01/26/22 6203   Service Assessment   Patient Orientation Alert and Oriented   Cognition Alert   History Provided By Patient   Primary Caregiver Self   Support Systems Parent;Family Members   Patient's Healthcare Decision Maker is: Legal Next of Kin   PCP Verified by CM Yes   Last Visit to PCP Within last 3 months   Prior Functional Level Independent in ADLs/IADLs   Current Functional Level Independent in ADLs/IADLs   Can patient return to prior living arrangement Yes   Ability to make needs known: Good   Family able to assist with home care needs: Yes   Would you like for me to discuss the discharge plan with any other family members/significant others, and if so, who? No   Financial Resources None   [facility] None   CM/SW Referral No PCP;Financial   Social/Functional History   Lives With Parent;Family   Condition of Participation: Discharge Planning   The Plan for Transition of Care is related to the following treatment goals: to be able to return home   The Patient and/or Patient Representative was provided with a Choice of Provider? Patient   The Patient and/Or Patient Representative agree with the Discharge Plan? Yes   Freedom of Choice list was provided with basic dialogue that supports the patient's individualized plan of care/goals, treatment preferences, and shares the quality data associated with the providers?  Yes     Assessment completed with patient at bedside. Patient alert and oriented. Patient reports she lives with family. Patient is IND at baseline and has no home DME. Patient does not have insurance or a PCP. Patient is agreeable to resources. CM gave information for [place] [facility], [facility], and [facility]. CM following  for discharge planning.     Nancy Marus LBSW, ACM  [facility] Case Manager

## 2022-01-26 NOTE — Progress Notes (Signed)
Patient on Continuous Oximetry - Cap #21       01/26/22 0813   Oxygen Therapy/Pulse Ox   O2 Device Nasal cannula   O2 Flow Rate (L/min) 2 L/min   Pulse 89   SpO2 96 %   Pulse Oximeter Device Mode Continuous

## 2022-01-26 NOTE — Progress Notes (Signed)
Hospitalist History and Physical   Admit Date:  01/25/2022  7:25 AM   Name:  Ann Medina   Age:  21 y.o.  Sex:  female  DOB:  2001/06/02   MRN:  [id number]   Room:  340/01    Presenting/Chief Complaint: Shortness of Breath     Reason(s) for Admission: Shortness of breath [R06.02]  Acute asthma exacerbation [J45.901]  Acute exacerbation of extrinsic asthma [J45.901]     History of Present Illness:       Ann Medina is a 21 y.o. female with medical history of asthma, allergies, GERD who is evaluated with shortness of breath/ wheezing.      She has been followed by [facility] but insurance lapsed.  Ran out of her meds this morning.     Had increased shortness of breath/wheezing since yesterday.  No fever  Feeling a little better after ER treatment.       Gets short of breath walking and talking      FULL CODE  Mother is Claudette Laws [phone]          Assessment & Plan:   Acute hypoxic respiratory failure due to severe persistent    Acute asthma exacerbation  Patient is saturating well on 2 LNC  Baseline not on any home oxygen   Chest x-ray normal  ABG showed respiratory alkalosis  D2 IV solumedrol 60 mg every 12 hours  Albuterol nebs every 6 hours   Pulmicort nebs every 12 hours   Plan to start [facility] as outpatient and follow-up with [facility]  Follow-up with RT for home oxygen assessment prior to discharge  Pulmonary following, patient recommendations    Atypical chest pain  EKG normal sinus rhythm  Troponins and D-dimer negative  Follow-up CT chest  Follow-up with echo  Pain management with tramadol  Cardiology consulted    Hyperglycemia in the setting of steroids  Blood glucose 252  Started on ISS    Leukocytosis of 17.5 K in the setting of steroids.      Active Problems:    GERD (gastroesophageal reflux disease)  PPI          Allergic rhinitis  Zyrtec, singulair      Elevated AST:  Follow-up with CMP in a.m.      PT/OT evals and PPD needed/ordered?  No  Diet: ADULT DIET; Regular  VTE  prophylaxis: SCD's   Code status: Full Code    Hospital Problems:  Principal Problem:    Acute asthma exacerbation  Active Problems:    GERD (gastroesophageal reflux disease)    Allergic rhinitis  Resolved Problems:    * No resolved hospital problems. *       Past History:     Past Medical History:   Diagnosis Date    Allergic rhinitis     Aspergillosis (HCC)     Asthma     Asthma     GERD (gastroesophageal reflux disease)     Molluscum contagiosum        PSH- none     Social History     Tobacco Use    Smoking status: Never    Smokeless tobacco: Not on file   Substance Use Topics    Alcohol use: rare      Social History     Substance and Sexual Activity   Drug Use No       Family History   Problem Relation Age of Onset    Other Other  CEREBROVASCULAR ACCIDENT    Hypertension Other     Coronary Art Dis Other     Diabetes Other         Immunization History   Administered Date(s) Administered    DTaP vaccine 02/15/1999, 04/21/1999, 06/22/1999, 12/18/2000, 11/12/2003    Hepatitis B vaccine 1998[birth year]09-07, 06/22/1999, 09/16/1999    Hib vaccine 02/15/1999, 04/21/1999, 06/22/1999, 05/03/2000    Influenza Virus Vaccine 08/10/2006, 02/28/2008    MMR, PRIORIX, M-M-R II, (age 3m), SC, 0.568m11/14/2001, 11/12/2003    Pneumococcal, PCV-13, PREVNAR 1333(age 6w+), IM, 0.69m83m8/28/2000, 04/21/1999, 06/22/1999, 05/02/2000    Poliovirus, IPOL, (age 6w+), SC/IM, 0.69mL59m/28/2000, 04/21/1999, 09/16/1999, 11/12/2003    Varicella, VARIVAX, (age 18m+86mC, 0.69mL 1769m4/2001, 01/08/2009     Allergies   Allergen Reactions    Peanut Oil Other (See Comments)    Montelukast Rash     States not allergic to this med     Prior to Admit Medications:  Current Outpatient Medications   Medication Instructions    albuterol sulfate HFA (PROVENTIL HFA) 108 (90 Base) MCG/ACT inhaler 2 puffs, Inhalation, EVERY 6 HOURS PRN    budesonide (PULMICORT) 500 mcg    budesonide-formoterol (SYMBICORT) 80-4.5 MCG/ACT AERO Inhale into the lungs    cetirizine  (ZYRTEC) 10 mg, Oral, DAILY    fluticasone (FLONASE) 50 MCG/ACT nasal spray 2 sprays    fluticasone-salmeterol (ADVAIR DISKUS) 250-50 MCG/ACT AEPB diskus inhaler 1 puff, Inhalation, EVERY 12 HOURS    levalbuterol (XOPENEX) 0.31 mg, EVERY 4 HOURS PRN    montelukast (SINGULAIR) 10 mg, Oral, DAILY    tiotropium (SPIRIVA) 18 MCG inhalation capsule 1 capsule, Inhalation, DAILY         Objective:   Patient Vitals for the past 24 hrs:   Temp Pulse Resp BP SpO2   01/26/22 1104 -- 93 15 -- 97 %   01/26/22 0813 -- 89 16 -- 96 %   01/26/22 0717 98 F (36.7 C) 83 18 126/73 95 %   01/26/22 0009 98.1 F (36.7 C) 91 18 131/85 96 %   01/25/22 2120 98.3 F (36.8 C) (!) 104 22 (!) 142/86 97 %   01/25/22 2037 -- 88 16 -- 98 %   01/25/22 1516 -- 97 18 -- 100 %       Oxygen Therapy  SpO2: 97 %  Pulse via Oximetry: 79 beats per minute  Pulse Oximeter Device Mode: Continuous  Pulse Oximeter Device Location: Right, Finger  O2 Device: Nasal cannula  Skin Assessment: Clean, dry, & intact  Skin Protection for O2 Device: No  O2 Flow Rate (L/min): 2 L/min    Estimated body mass index is 34.97 kg/m as calculated from the following:    Height as of this encounter: 5' 8"  (1.727 m).    Weight as of this encounter: 230 lb (104.3 kg).    Intake/Output Summary (Last 24 hours) at 01/26/2022 1218  Last data filed at 01/26/2022 0637  3664s per 24 hour   Intake 340 ml   Output 150 ml   Net 190 ml         Physical Exam:    General:    Well nourished.  Alert, some shortness of breath talking, audible expiratory wheezing   Head:  Normocephalic, atraumatic  Eyes:  Sclerae appear normal.  Pupils equally round.  ENT:  Nares appear normal.  Moist oral mucosa  Neck:  No restricted ROM.  Trachea midline   CV:   RRR.  No m/r/g.  No jugular  venous distension. No edema   Lungs:   Diffuse expiratory wheezing  Abdomen:   Soft, nontender, nondistended.  Extremities: No cyanosis or clubbing.  No edema  Skin:     No rashes and normal coloration.   Warm and dry.     Neuro:   grossly intact.    Psych:  Normal mood and affect.      Orders Placed This Encounter   Medications    dexamethasone (DECADRON) injection 10 mg    albuterol (PROVENTIL) (2.5 MG/3ML) 0.083% nebulizer solution     Order Specific Question:   Initiate RT Bronchodilator Protocol     Answer:   No    magnesium sulfate 2000 mg in 50 mL IVPB premix    ipratropium (ATROVENT) 0.02 % nebulizer solution 0.5 mg     Order Specific Question:   Initiate RT Bronchodilator Protocol     Answer:   No    albuterol (PROVENTIL) (2.5 MG/3ML) 0.083% nebulizer solution     Leafy Ro: cabinet override    albuterol (PROVENTIL) (2.5 MG/3ML) 0.083% nebulizer solution 10 mg     Order Specific Question:   Initiate RT Bronchodilator Protocol     Answer:   Yes - Inpatient Protocol    predniSONE (DELTASONE) tablet 40 mg    ipratropium 0.5 mg-albuterol 2.5 mg (DUONEB) nebulizer solution 1 Dose     Order Specific Question:   Initiate RT Bronchodilator Protocol     Answer:   No    albuterol (PROVENTIL) (2.5 MG/3ML) 0.083% nebulizer solution 2.5 mg     Order Specific Question:   Initiate RT Bronchodilator Protocol     Answer:   Yes - Inpatient Protocol    budesonide (PULMICORT) nebulizer suspension 500 mcg    DISCONTD: methylPREDNISolone sodium succ (SOLU-MEDROL) 60 mg in sterile water 0.96 mL injection    sodium chloride flush 0.9 % injection 5-40 mL    sodium chloride flush 0.9 % injection 5-40 mL    0.9 % sodium chloride infusion    OR Linked Order Group     ondansetron (ZOFRAN-ODT) disintegrating tablet 4 mg     ondansetron (ZOFRAN) injection 4 mg    polyethylene glycol (GLYCOLAX) packet 17 g    OR Linked Order Group     acetaminophen (TYLENOL) tablet 650 mg     acetaminophen (TYLENOL) suppository 650 mg    cetirizine (ZYRTEC) tablet 10 mg    montelukast (SINGULAIR) tablet 10 mg    pantoprazole (PROTONIX) tablet 40 mg    morphine injection 2 mg    ibuprofen (ADVIL;MOTRIN) tablet 400 mg    morphine injection 1 mg    traMADol (ULTRAM)  tablet 25 mg    methylPREDNISolone sodium succ (SOLU-MEDROL) 60 mg in sterile water 0.96 mL injection    iopamidol (ISOVUE-370) 76 % injection 100 mL    sodium chloride flush 0.9 % injection 10 mL    sodium chloride 0.9 % bolus 100 mL       I have personally reviewed labs and tests:  Recent Labs:  Recent Results (from the past 24 hour(s))   Troponin    Collection Time: 01/25/22  9:37 PM   Result Value Ref Range    Troponin, High Sensitivity <3.0 0 - 14 pg/mL   EKG 12 Lead    Collection Time: 01/25/22  9:48 PM   Result Value Ref Range    Ventricular Rate 97 BPM    Atrial Rate 97 BPM    P-R Interval 142 ms  QRS Duration 86 ms    Q-T Interval 344 ms    QTc Calculation (Bazett) 436 ms    P Axis 61 degrees    R Axis 17 degrees    T Axis 17 degrees    Diagnosis       Normal sinus rhythm  Possible Left atrial enlargement  Borderline ECG  No previous ECGs available  Confirmed by SIACHOS  MD (UC), A. THOMAS ([id number]) on 01/26/2022 5:05:45 AM     Troponin    Collection Time: 01/26/22  1:44 AM   Result Value Ref Range    Troponin, High Sensitivity <3.0 0 - 14 pg/mL   Troponin    Collection Time: 01/26/22  6:07 AM   Result Value Ref Range    Troponin, High Sensitivity 4.7 0 - 14 pg/mL   CBC with Auto Differential    Collection Time: 01/26/22  6:07 AM   Result Value Ref Range    WBC 17.5 (H) 4.3 - 11.1 K/uL    RBC 4.12 4.05 - 5.2 M/uL    Hemoglobin 12.7 11.7 - 15.4 g/dL    Hematocrit 39.1 35.8 - 46.3 %    MCV 94.9 82.0 - 102.0 FL    MCH 30.8 26.1 - 32.9 PG    MCHC 32.5 31.4 - 35.0 g/dL    RDW 12.6 11.9 - 14.6 %    Platelets 379 150 - 450 K/uL    MPV 10.5 9.4 - 12.3 FL    nRBC 0.00 0.0 - 0.2 K/uL    Differential Type AUTOMATED      Neutrophils % 90 (H) 43 - 78 %    Lymphocytes % 7 (L) 13 - 44 %    Monocytes % 3 (L) 4.0 - 12.0 %    Eosinophils % 0 (L) 0.5 - 7.8 %    Basophils % 0 0.0 - 2.0 %    Immature Granulocytes 1 0.0 - 5.0 %    Neutrophils Absolute 15.7 (H) 1.7 - 8.2 K/UL    Lymphocytes Absolute 1.2 0.5 - 4.6 K/UL    Monocytes  Absolute 0.5 0.1 - 1.3 K/UL    Eosinophils Absolute 0.0 0.0 - 0.8 K/UL    Basophils Absolute 0.0 0.0 - 0.2 K/UL    Absolute Immature Granulocyte 0.1 0.0 - 0.5 K/UL   Basic Metabolic Panel w/ Reflex to MG    Collection Time: 01/26/22  6:07 AM   Result Value Ref Range    Sodium 137 133 - 143 mmol/L    Potassium 4.7 3.5 - 5.1 mmol/L    Chloride 108 101 - 110 mmol/L    CO2 20 (L) 21 - 32 mmol/L    Anion Gap 9 2 - 11 mmol/L    Glucose 252 (H) 65 - 100 mg/dL    BUN 13 6 - 23 MG/DL    Creatinine 1.08 (H) 0.6 - 1.0 MG/DL    Est, Glom Filt Rate >60 >60 ml/min/1.62m    Calcium 9.0 8.3 - 10.4 MG/DL   Hepatic Function Panel    Collection Time: 01/26/22  6:07 AM   Result Value Ref Range    Total Protein 7.5 6.3 - 8.2 g/dL    Albumin 3.4 (L) 3.5 - 5.0 g/dL    Globulin 4.1 2.8 - 4.5 g/dL    Albumin/Globulin Ratio 0.8 0.4 - 1.6      Total Bilirubin 0.2 0.2 - 1.1 MG/DL    Bilirubin, Direct 0.1 <0.4 MG/DL    Alk Phosphatase 127 50 -  136 U/L    AST 40 (H) 15 - 37 U/L    ALT 69 (H) 12 - 65 U/L   Troponin    Collection Time: 01/26/22  6:07 AM   Result Value Ref Range    Troponin, High Sensitivity 3.4 0 - 14 pg/mL   Transthoracic echocardiogram (TTE) complete with contrast, bubble, strain, and 3D PRN    Collection Time: 01/26/22 10:14 AM   Result Value Ref Range    LV EDV A2C 106 mL    LV EDV A4C 91 mL    LV ESV A2C 39 mL    LV ESV A4C 32 mL    IVSd 1.1 (A) 0.6 - 0.9 cm    LVIDd 4.4 3.9 - 5.3 cm    LVIDs 2.7 cm    LVOT Diameter 2.0 cm    LVOT Mean Gradient 3 mmHg    LVOT VTI 22.7 cm    LVOT Peak Velocity 1.2 m/s    LVOT Peak Gradient 6 mmHg    LVPWd 0.9 0.6 - 0.9 cm    LV E' Lateral Velocity 14 cm/s    LV E' Septal Velocity 13 cm/s    LV Ejection Fraction A2C 64 %    LV Ejection Fraction A4C 65 %    EF BP 63 55 - 100 %    LVOT Area 3.1 cm2    LVOT SV 71.3 ml    LA Minor Axis 5.4 cm    LA Major Axis 6.0 cm    LA Area 2C 18.2 cm2    LA Area 4C 19.1 cm2    LA Volume 2C 50 22 - 52 mL    LA Volume 4C 49 22 - 52 mL    LA Volume BP 52 22 - 52  mL    LA Diameter 3.3 cm    AV Mean Velocity 1.3 m/s    AV Mean Gradient 7 mmHg    AV VTI 36.0 cm    AV Peak Velocity 2.0 m/s    AV Peak Gradient 15 mmHg    AV Area by VTI 2.0 cm2    AV Area by Peak Velocity 1.9 cm2    Aortic Root 2.6 cm    Ascending Aorta 2.7 cm    IVC Expiration 1.5 cm    MV E Wave Deceleration Time 161.0 ms    MV A Velocity 0.65 m/s    MV E Velocity 1.00 m/s    PV AT 116.0 ms    PV Max Velocity 1.2 m/s    PV Peak Gradient 6 mmHg    RV Basal Dimension 2.7 cm    RV Free Wall Peak S' 16 cm/s    TAPSE 2.0 1.7 cm    Body Surface Area 2.24 m2    Fractional Shortening 2D 39 28 - 44 %    LV ESV Index A4C 15 mL/m2    LV EDV Index A4C 42 mL/m2    LV ESV Index A2C 18 mL/m2    LV EDV Index A2C 49 mL/m2    LVIDd Index 2.03 cm/m2    LVIDs Index 1.24 cm/m2    LV RWT Ratio 0.41     LV Mass 2D 147.8 67 - 162 g    LV Mass 2D Index 68.1 43 - 95 g/m2    MV E/A 1.54     E/E' Ratio (Averaged) 7.42     E/E' Lateral 7.14     E/E' Septal 7.69  LA Volume Index BP 24 16 - 34 ml/m2    LVOT Stroke Volume Index 32.8 mL/m2    LA Volume Index 2C 23 16 - 34 mL/m2    LA Volume Index 4C 23 16 - 34 mL/m2    LA Size Index 1.52 cm/m2    LA/AO Root Ratio 1.27     Ao Root Index 1.20 cm/m2    Ascending Aorta Index 1.24 cm/m2    AV Velocity Ratio 0.60     LVOT:AV VTI Index 0.63     AVA/BSA VTI 0.9 cm2/m2    AVA/BSA Peak Velocity 0.9 cm2/m2    Est. RA Pressure 3 mmHg   D-Dimer, Quantitative    Collection Time: 01/26/22 10:14 AM   Result Value Ref Range    D-Dimer, Quant <0.27 <0.56 ug/ml(FEU)       I have personally reviewed imaging studies:  XR CHEST PORTABLE    Result Date: 01/25/2022  Title: Chest Radiographs Indication:  Shortness of breath and wheezing. Comparison: None. Findings:  Lungs/pleura:No evidence of consolidation or effusion Vascularity:Normal Vasculature Cardiac:Normal size cardiac silhouette Mediastinum:Normal Bones:No obvious acute fracture     No acute process          Signed:  Humberto Leep, MD    Part of this note may  have been written by using a voice dictation software.  The note has been proof read but may still contain some grammatical/other typographical errors.

## 2022-01-26 NOTE — Plan of Care (Signed)
Problem: Discharge Planning  Goal: Discharge to home or other facility with appropriate resources  Outcome: Progressing     Problem: Pain  Goal: Verbalizes/displays adequate comfort level or baseline comfort level  Outcome: Progressing

## 2022-01-27 LAB — CBC WITH AUTO DIFFERENTIAL
Absolute Immature Granulocyte: 0.2 10*3/uL (ref 0.0–0.5)
Basophils %: 0 % (ref 0.0–2.0)
Basophils Absolute: 0 10*3/uL (ref 0.0–0.2)
Eosinophils %: 0 % — ABNORMAL LOW (ref 0.5–7.8)
Eosinophils Absolute: 0 10*3/uL (ref 0.0–0.8)
Hematocrit: 37.5 % (ref 35.8–46.3)
Hemoglobin: 12.1 g/dL (ref 11.7–15.4)
Immature Granulocytes: 1 % (ref 0.0–5.0)
Lymphocytes %: 5 % — ABNORMAL LOW (ref 13–44)
Lymphocytes Absolute: 1.2 10*3/uL (ref 0.5–4.6)
MCH: 31.3 PG (ref 26.1–32.9)
MCHC: 32.3 g/dL (ref 31.4–35.0)
MCV: 97.2 FL (ref 82.0–102.0)
MPV: 10.4 FL (ref 9.4–12.3)
Monocytes %: 4 % (ref 4.0–12.0)
Monocytes Absolute: 0.8 10*3/uL (ref 0.1–1.3)
Neutrophils %: 90 % — ABNORMAL HIGH (ref 43–78)
Neutrophils Absolute: 19.8 10*3/uL — ABNORMAL HIGH (ref 1.7–8.2)
Platelets: 347 10*3/uL (ref 150–450)
RBC: 3.86 M/uL — ABNORMAL LOW (ref 4.05–5.2)
RDW: 12.7 % (ref 11.9–14.6)
WBC: 22 10*3/uL — ABNORMAL HIGH (ref 4.3–11.1)
nRBC: 0 10*3/uL (ref 0.0–0.2)

## 2022-01-27 LAB — COMPREHENSIVE METABOLIC PANEL W/ REFLEX TO MG FOR LOW K
ALT: 76 U/L — ABNORMAL HIGH (ref 12–65)
AST: 29 U/L (ref 15–37)
Albumin/Globulin Ratio: 0.9 (ref 0.4–1.6)
Albumin: 3.2 g/dL — ABNORMAL LOW (ref 3.5–5.0)
Alk Phosphatase: 115 U/L (ref 50–130)
Anion Gap: 6 mmol/L (ref 2–11)
BUN: 14 MG/DL (ref 6–23)
CO2: 25 mmol/L (ref 21–32)
Calcium: 9 MG/DL (ref 8.3–10.4)
Chloride: 107 mmol/L (ref 101–110)
Creatinine: 0.96 MG/DL (ref 0.6–1.0)
Est, Glom Filt Rate: >60 mL/min/{1.73_m2} (ref >60)
Globulin: 3.7 g/dL (ref 2.8–4.5)
Glucose: 285 mg/dL — ABNORMAL HIGH (ref 65–100)
Potassium: 5 mmol/L (ref 3.5–5.1)
Sodium: 138 mmol/L (ref 133–143)
Total Bilirubin: 0.2 MG/DL (ref 0.2–1.1)
Total Protein: 6.9 g/dL (ref 6.3–8.2)

## 2022-01-27 LAB — HEMOGLOBIN A1C
Hemoglobin A1C: 5.4 % (ref 4.8–5.6)
eAG: 108 mg/dL

## 2022-01-27 LAB — POCT GLUCOSE
POC Glucose: 321 mg/dL — ABNORMAL HIGH (ref 65–100)
POC Glucose: 269 mg/dL — ABNORMAL HIGH (ref 65–100)
POC Glucose: 372 mg/dL — ABNORMAL HIGH (ref 65–100)
POC Glucose: 298 mg/dL — ABNORMAL HIGH (ref 65–100)

## 2022-01-27 LAB — PHOSPHORUS: Phosphorus: 3.8 MG/DL (ref 2.5–4.5)

## 2022-01-27 MED ORDER — INSULIN NPH (HUMAN) (ISOPHANE) 100 UNIT/ML SC SUSP
100 UNIT/ML | Freq: Every day | SUBCUTANEOUS | Status: AC
Start: 2022-01-27 — End: 2022-01-28
  Administered 2022-01-27: 18:00:00 10 [IU] via SUBCUTANEOUS

## 2022-01-27 MED ORDER — INSULIN LISPRO 100 UNIT/ML IJ SOLN
100 UNIT/ML | Freq: Three times a day (TID) | INTRAMUSCULAR | Status: DC
Start: 2022-01-27 — End: 2022-01-27
  Administered 2022-01-27 (×2): 2 [IU] via SUBCUTANEOUS

## 2022-01-27 MED ORDER — STERILE WATER FOR INJECTION (MIXTURES ONLY)
40 MG | Freq: Two times a day (BID) | INTRAMUSCULAR | Status: AC
Start: 2022-01-27 — End: 2022-01-28
  Administered 2022-01-27 – 2022-01-28 (×2): 40 mg via INTRAVENOUS

## 2022-01-27 MED ORDER — ALBUTEROL SULFATE (2.5 MG/3ML) 0.083% IN NEBU
Freq: Four times a day (QID) | RESPIRATORY_TRACT | Status: AC
Start: 2022-01-27 — End: 2022-01-29
  Administered 2022-01-27 – 2022-01-29 (×5): 2.5 mg via RESPIRATORY_TRACT

## 2022-01-27 MED FILL — HUMALOG 100 UNIT/ML IJ SOLN: 100 UNIT/ML | INTRAMUSCULAR | Qty: 4

## 2022-01-27 MED FILL — HUMALOG 100 UNIT/ML IJ SOLN: 100 UNIT/ML | INTRAMUSCULAR | Qty: 2

## 2022-01-27 MED FILL — METHYLPREDNISOLONE SODIUM SUCC 40 MG IJ SOLR: 40 MG | INTRAMUSCULAR | Qty: 40

## 2022-01-27 MED FILL — NOVOLIN N 100 UNIT/ML SC SUSP: 100 UNIT/ML | SUBCUTANEOUS | Qty: 10

## 2022-01-27 MED FILL — ALBUTEROL SULFATE (2.5 MG/3ML) 0.083% IN NEBU: RESPIRATORY_TRACT | Qty: 3

## 2022-01-27 MED FILL — ACETAMINOPHEN 325 MG PO TABS: 325 MG | ORAL | Qty: 2

## 2022-01-27 MED FILL — CETIRIZINE HCL 10 MG PO TABS: 10 MG | ORAL | Qty: 1

## 2022-01-27 MED FILL — PANTOPRAZOLE SODIUM 40 MG PO TBEC: 40 MG | ORAL | Qty: 1

## 2022-01-27 MED FILL — MONTELUKAST SODIUM 10 MG PO TABS: 10 MG | ORAL | Qty: 1

## 2022-01-27 MED FILL — BUDESONIDE 0.5 MG/2ML IN SUSP: 0.5 MG/2ML | RESPIRATORY_TRACT | Qty: 2

## 2022-01-27 MED FILL — METHYLPREDNISOLONE SODIUM SUCC 125 MG IJ SOLR: 125 MG | INTRAMUSCULAR | Qty: 125

## 2022-01-27 NOTE — Other (Signed)
Patient admitted with acute asthma exacerbation. Admitting blood glucose 103. HbA1c 5.4%. Blood glucose ranged 227-321 yesterday with patient receiving Humalog 7 units and Solumedrol 180mg . Blood glucose this morning was 269. Most recent POC glucose 372. Creatinine 0.96. GFR >60. Reviewed patient current regimen: Humalog 2 units with meals, Humalog correctional insulin, and Solumedrol 60mg  q12h. Per chart review patient has a history of prediabetes and steroid induced hyperglycemia as noted in Dr. pediatric endocrinology office visit in 2016 see care everywhere for details. Per chart review patient was prescribed Metformin at this time and was going to work on weight loss and physical activity. Noted in 2018 patient also saw Dr. 2017 at which time she had been off Metformin with A1c 5.8% and was encouraged to continue lifestyle modifications. A few months later A1c 5.3% with lifestyle modification in 2018 per provider note. Diabetes management consult acknowledged for medication recommendations. Patient would likely benefit from initiation of intermediate acting insulin to help offset hyperglycemia related to steroid therapy. Provider updated via [employer] regarding recommendations and patient glycemic control. New orders received to start NPH 10 units daily now and d/c prandial insulin. As steroid therapy is weaned patient insulin needs will likely decrease.

## 2022-01-27 NOTE — Progress Notes (Signed)
01/27/22 1004   RT Protocol   History Pulmonary Disease 0   Respiratory pattern 0   Breath sounds 4   Cough 0   Indications for Bronchodilator Therapy On home bronchodilators   Bronchodilator Assessment Score 4           Respiratory Care Services Policy Number: [id number]    Title: Aerosolized Medication Protocol    Effective Date: 03/1997    Revised Date: 06/13, 03/16, 11/17, 07/19     Reviewed Date: 05/14/ 03/15 , 06/17, 5/18, 04/2019   I.  Policy: The Aerosolized Medication Protocol shall by implemented by Respiratory Care Practitioners (RCP) for patients with orders to receive aerosol therapy with medication.     II. Purpose:  To open and maintain obstructed airways, the RCP, will utilize the following   protocol to select the indicated aerosolized medication(s) and determine the most effective method of delivery to the patient.    III. Patient Type: All patients who are determined to meet aerosolized medication criteria as          outlined in this protocol.    IV. Responsibility: Director, Respiratory Care Services, registered Respiratory Care Practitioners (RCP's) with documented competency in the performance of                                     respiratory therapeutic techniques.    V. Equipment needed:  Stethoscope  Pulse oximeter  AeroEclipse nebulizer  Meter Dose Inhaler (MDI)    VI. Protocol:   The following conditions are accepted indications for aerosolized medication therapy.   Bronchospasm/wheezing  Impaired mucociliary clearance  Tracheobronchial mucosal congestion/and laryngeal stridor  Diseases which commonly require aerosolized medication therapy include, but are not limited to:  Asthma/reactive airway disease  Bronchitis/emphysema (COPD)  Cystic fibrosis  Severe laryngitis/tracheitis  Bronchiectasis  Smoke inhalation or chemical trauma to the lung or upper airway  Physical trauma to the upper airway  Laryngotracheobronchitis  Bronchiolitis  Non-specific wheezing              B. Indications  for bronchodilator medications will include:  Bronchospasm/ wheezing  Asthma/reactive airway disease  Chronic obstructive pulmonary disease  Obstructive defect on pulmonary function testing  Administration of medications  If a bronchodilator or any other type of respiratory medication is needed, a physician order must be indicated in the medication section in the patient's EMR.   When the physician specifies the medication and dosage at the time of request, the ordered medication will be used as part of the care plan.  The following guidelines will be utilized in the evaluation and selection of the appropriate delivery device for indicated medication(s):  MDI is the preferred method of medication delivery via common canister.  Patient should be alert/cooperative  Able to perform 3 second breath hold.  Patient may be changed to self-administer as appropriate.   Patients in isolation will be provided an individual inhaler.  Unassisted aerosol (UA) is indicated if  Ventilation is inadequate  Patient is unable to perform MDI appropriately     VII. Guidelines:   Monitor patient's vital signs and evaluate patient's clinical status. The need to change medication and/or modality may be indicated by:  A pulse greater than 120 bpm, or if a pulse increase of 20 bpm occurs with bronchodilator medications.  Significant worsening of dyspnea or wheezing occurring during or within 30 minutes of discontinuing therapy.  Worsening of patient's  sensorium (e.g. patient becomes confused or obtunded, and unable to follow directions).  Worsening of patient's chest x-ray.  Change in sputum (e.g. increased pulmonary infiltrate, which might indicate need for volume expansion therapy).  Patient has difficulty coughing up secretions, which might indicate need for acetylcysteine and/or bronchial hygiene therapy.  Call physician immediately if dyspnea worsens and is not responsive to modifications allowed by protocol.    VIII. Clinical  Responsibility:  The therapy assessment guidelines will be used to evaluate all patients receiving aerosolized medications with the exception of critical care areas.    RCP's will perform changes in therapy per protocol.   It will be the responsibility of RCP to provide instruction regarding respiratory medications, possible side effects, aerosol therapy and proper MDI technique, as well as, spacer usage to patients ordered MDI therapy.   Current therapy that is part of a patient's home regimen will not be discontinued.  Provide spacer and educate patient on proper inhaler technique if needed.    IX. Documentation  Document assessment findings in the respiratory assessment section of the patient's EMR.  Document changes in therapy per protocol in the respiratory orders section and in the care plan section of the patient's EMR.  Document patient education in the patient education section of the patient's EMR.  X. Outcome Criteria:  Relief of wheezes and obstruction  Improved cough and sputum color and consistency  Improved chest x-ray  Improved arterial oxygen tension and or SaO2  Improved Peak Flow on asthmatic patients        XI. Related Policy/Protocols:  Respiratory Patient Care Protocols  Bronchial Hygiene Therapy  Oxygen Protocol  [place] Administration of Metered Dose Inhalers via a Common Canister  [facility] Clinical Resources   [facility] Aerosol Generating Procedures  Reference:  [place], Procedure and Protocol Guideline Manual, 1995, J.Tietsort.  L -  Therapist Driven Respiratory Care Protocols - A Practitioner's Guide for Criteria-Based Respiratory Care by Chinita Greenland, M.D., and J. Tietsort, RN, RRT.  L - The rationale for therapist-driven protocols: an update. Respiratory Care 1998; [id number].  N -[facility] Clinical Practice Guidelines.

## 2022-01-27 NOTE — Progress Notes (Signed)
Hospitalist progress note   Admit Date:  01/25/2022  7:25 AM   Name:  Ann Medina   Age:  21 y.o.  Sex:  female  DOB:  2000-08-13   MRN:  [id number]   Room:  340/01    Presenting/Chief Complaint: Shortness of Breath     Reason(s) for Admission: Shortness of breath [R06.02]  Acute asthma exacerbation [J45.901]  Acute exacerbation of extrinsic asthma [J45.901]     History of Present Illness:       Ann Medina is a 21 y.o. female with medical history of asthma, allergies, GERD who is evaluated with shortness of breath/ wheezing.      She has been followed by [facility] but insurance lapsed.  Ran out of her meds this morning.     Had increased shortness of breath/wheezing since yesterday.  No fever  Feeling a little better after ER treatment.       Gets short of breath walking and talking      FULL CODE  Mother is Claudette Laws [phone]    Subjective  Patient was seen and examined bedside. She was laying comfortably on the bed.  Family was at the bedside.  She was saturating well on room air and was off of oxygen.     Assessment & Plan:   Acute hypoxic respiratory failure due to severe persistent    Acute asthma exacerbation  Patient is saturating well on RA  Baseline not on any home oxygen   Chest x-ray normal  ABG showed respiratory alkalosis  D3 Decreased IV solumedrol 40 mg every 12 hours  Will switch to p.o. prednisone in am   Continue with Advair, Spiriva, Singulair  Plan to start Biologics as outpatient and follow-up with [facility]  Follow-up with RT for home oxygen assessment prior to discharge  Pulmonary following, appreciate recommendations    Atypical chest pain  Most likely musculoskeletal in nature  EKG normal sinus rhythm  Troponins and D-dimer negative  CT chest negative for PE   echo with normal ejection fraction  Pain management with tramadol  No further further cardiac evaluation is needed  Cardiology signed off, patient recommendations    Uncontrolled hyperglycemia in the setting of  steroids  Blood glucose ranging around 300s  Follow-up with HbA1c  Started on NPH 10 units  Continue ISS  Diabetes educator consulted, appreciate conditions    Leukocytosis of 22 K in the setting of steroids.  Follow-up with [facility]A      Active Problems:    GERD (gastroesophageal reflux disease)  PPI          Allergic rhinitis  Zyrtec, singulair      Elevated AST:  Resolved      PT/OT evals and PPD needed/ordered?  No  Diet: ADULT DIET; Regular  VTE prophylaxis: SCD's   Code status: Full Code    Hospital Problems:  Principal Problem:    Acute asthma exacerbation  Active Problems:    GERD (gastroesophageal reflux disease)    Allergic rhinitis    Shortness of breath    Non-cardiac chest pain  Resolved Problems:    * No resolved hospital problems. *       Past History:     Past Medical History:   Diagnosis Date    Allergic rhinitis     Aspergillosis (HCC)     Asthma     Asthma     GERD (gastroesophageal reflux disease)     Molluscum contagiosum  PSH- none     Social History     Tobacco Use    Smoking status: Never    Smokeless tobacco: Not on file   Substance Use Topics    Alcohol use: rare      Social History     Substance and Sexual Activity   Drug Use No       Family History   Problem Relation Age of Onset    Other Other         CEREBROVASCULAR ACCIDENT    Hypertension Other     Coronary Art Dis Other     Diabetes Other         Immunization History   Administered Date(s) Administered    DTaP vaccine 02/15/1999, 04/21/1999, 06/22/1999, 12/18/2000, 11/12/2003    Hepatitis B vaccine 2000-10-25, 06/22/1999, 09/16/1999    Hib vaccine 02/15/1999, 04/21/1999, 06/22/1999, 05/03/2000    Influenza Virus Vaccine 08/10/2006, 02/28/2008    MMR, PRIORIX, M-M-R II, (age 67m), SC, 0.566m11/14/2001, 11/12/2003    Pneumococcal, PCV-13, PREVNAR 1326(age 6w+), IM, 0.71m74m8/28/2000, 04/21/1999, 06/22/1999, 05/02/2000    Poliovirus, IPOL, (age 6w+), SC/IM, 0.71mL63m/28/2000, 04/21/1999, 09/16/1999, 11/12/2003    Varicella, VARIVAX, (age  53m+49mC, 0.71mL 1471m4/2001, 01/08/2009     Allergies   Allergen Reactions    Peanut Oil Other (See Comments)    Montelukast Rash     States not allergic to this med     Prior to Admit Medications:  Current Outpatient Medications   Medication Instructions    albuterol sulfate HFA (PROVENTIL HFA) 108 (90 Base) MCG/ACT inhaler 2 puffs, Inhalation, EVERY 6 HOURS PRN    budesonide (PULMICORT) 500 mcg    budesonide-formoterol (SYMBICORT) 80-4.5 MCG/ACT AERO Inhale into the lungs    cetirizine (ZYRTEC) 10 mg, Oral, DAILY    fluticasone (FLONASE) 50 MCG/ACT nasal spray 2 sprays    fluticasone-salmeterol (ADVAIR DISKUS) 250-50 MCG/ACT AEPB diskus inhaler 1 puff, Inhalation, EVERY 12 HOURS    levalbuterol (XOPENEX) 0.31 mg, EVERY 4 HOURS PRN    montelukast (SINGULAIR) 10 mg, Oral, DAILY    tiotropium (SPIRIVA) 18 MCG inhalation capsule 1 capsule, Inhalation, DAILY         Objective:   Patient Vitals for the past 24 hrs:   Temp Pulse Resp BP SpO2   01/27/22 0755 -- 88 17 -- 98 %   01/27/22 0729 98.4 F (36.9 C) 70 17 126/71 96 %   01/26/22 2050 98.4 F (36.9 C) 94 22 134/78 97 %   01/26/22 2047 -- 94 16 -- 97 %   01/26/22 1543 -- 87 14 -- 96 %       Oxygen Therapy  SpO2: 98 %  Pulse via Oximetry: 79 beats per minute  Pulse Oximeter Device Mode: Continuous  Pulse Oximeter Device Location: Right, Finger  O2 Device: None (Room air)  Skin Assessment: Clean, dry, & intact  Skin Protection for O2 Device: No  O2 Flow Rate (L/min): 2 L/min    Estimated body mass index is 34.97 kg/m as calculated from the following:    Height as of this encounter: '5\' 8"'  (1.727 m).    Weight as of this encounter: 230 lb (104.3 kg).    Intake/Output Summary (Last 24 hours) at 01/27/2022 1358  Last data filed at 01/27/2022 1300  Gross per 24 hour   Intake --   Output 3200 ml   Net -3200 ml         Physical Exam:    General:  Well nourished.  Alert, some shortness of breath talking, audible expiratory wheezing   Head:  Normocephalic,  atraumatic  Eyes:  Sclerae appear normal.  Pupils equally round.  ENT:  Nares appear normal.  Moist oral mucosa  Neck:  No restricted ROM.  Trachea midline   CV:   RRR.  No m/r/g.  No jugular venous distension. No edema   Lungs:   Diffuse expiratory wheezing  Abdomen:   Soft, nontender, nondistended.  Extremities: No cyanosis or clubbing.  No edema  Skin:     No rashes and normal coloration.   Warm and dry.    Neuro:   grossly intact.    Psych:  Normal mood and affect.      Orders Placed This Encounter   Medications    dexamethasone (DECADRON) injection 10 mg    albuterol (PROVENTIL) (2.5 MG/3ML) 0.083% nebulizer solution     Order Specific Question:   Initiate RT Bronchodilator Protocol     Answer:   No    magnesium sulfate 2000 mg in 50 mL IVPB premix    ipratropium (ATROVENT) 0.02 % nebulizer solution 0.5 mg     Order Specific Question:   Initiate RT Bronchodilator Protocol     Answer:   No    albuterol (PROVENTIL) (2.5 MG/3ML) 0.083% nebulizer solution     [place]afy Ro: cabinet override    albuterol (PROVENTIL) (2.5 MG/3ML) 0.083% nebulizer solution 10 mg     Order Specific Question:   Initiate RT Bronchodilator Protocol     Answer:   Yes - Inpatient Protocol    predniSONE (DELTASONE) tablet 40 mg    ipratropium 0.5 mg-albuterol 2.5 mg (DUONEB) nebulizer solution 1 Dose     Order Specific Question:   Initiate RT Bronchodilator Protocol     Answer:   No    DISCONTD: albuterol (PROVENTIL) (2.5 MG/3ML) 0.083% nebulizer solution 2.5 mg     Order Specific Question:   Initiate RT Bronchodilator Protocol     Answer:   Yes - Inpatient Protocol    budesonide (PULMICORT) nebulizer suspension 500 mcg    DISCONTD: methylPREDNISolone sodium succ (SOLU-MEDROL) 60 mg in sterile water 0.96 mL injection    sodium chloride flush 0.9 % injection 5-40 mL    sodium chloride flush 0.9 % injection 5-40 mL    0.9 % sodium chloride infusion    OR Linked Order Group     ondansetron (ZOFRAN-ODT) disintegrating tablet 4 mg      ondansetron (ZOFRAN) injection 4 mg    polyethylene glycol (GLYCOLAX) packet 17 g    OR Linked Order Group     acetaminophen (TYLENOL) tablet 650 mg     acetaminophen (TYLENOL) suppository 650 mg    cetirizine (ZYRTEC) tablet 10 mg    montelukast (SINGULAIR) tablet 10 mg    pantoprazole (PROTONIX) tablet 40 mg    morphine injection 2 mg    ibuprofen (ADVIL;MOTRIN) tablet 400 mg    morphine injection 1 mg    traMADol (ULTRAM) tablet 25 mg    DISCONTD: methylPREDNISolone sodium succ (SOLU-MEDROL) 60 mg in sterile water 0.96 mL injection    iopamidol (ISOVUE-370) 76 % injection 100 mL    sodium chloride flush 0.9 % injection 10 mL    sodium chloride 0.9 % bolus 100 mL    insulin lispro (HUMALOG) injection vial 0-4 Units    insulin lispro (HUMALOG) injection vial 0-4 Units    DISCONTD: insulin lispro (HUMALOG) injection vial 2 Units  albuterol (PROVENTIL) (2.5 MG/3ML) 0.083% nebulizer solution 2.5 mg     Order Specific Question:   Initiate RT Bronchodilator Protocol     Answer:   Yes - Inpatient Protocol    methylPREDNISolone sodium succ (SOLU-MEDROL) 40 mg in sterile water 1 mL injection    insulin NPH (HumuLIN N;NovoLIN N) injection vial 10 Units       I have personally reviewed labs and tests:  Recent Labs:  Recent Results (from the past 24 hour(s))   POCT Glucose    Collection Time: 01/26/22  4:35 PM   Result Value Ref Range    POC Glucose 227 (H) 65 - 100 mg/dL    Performed by: BridgesEmilyPCT    POCT Glucose    Collection Time: 01/26/22  9:34 PM   Result Value Ref Range    POC Glucose 321 (H) 65 - 100 mg/dL    Performed by: PritchardAniquaPCT    CBC with Auto Differential    Collection Time: 01/27/22  6:51 AM   Result Value Ref Range    WBC 22.0 (H) 4.3 - 11.1 K/uL    RBC 3.86 (L) 4.05 - 5.2 M/uL    Hemoglobin 12.1 11.7 - 15.4 g/dL    Hematocrit 37.5 35.8 - 46.3 %    MCV 97.2 82.0 - 102.0 FL    MCH 31.3 26.1 - 32.9 PG    MCHC 32.3 31.4 - 35.0 g/dL    RDW 12.7 11.9 - 14.6 %    Platelets 347 150 - 450 K/uL    MPV  10.4 9.4 - 12.3 FL    nRBC 0.00 0.0 - 0.2 K/uL    Differential Type AUTOMATED      Neutrophils % 90 (H) 43 - 78 %    Lymphocytes % 5 (L) 13 - 44 %    Monocytes % 4 4.0 - 12.0 %    Eosinophils % 0 (L) 0.5 - 7.8 %    Basophils % 0 0.0 - 2.0 %    Immature Granulocytes 1 0.0 - 5.0 %    Neutrophils Absolute 19.8 (H) 1.7 - 8.2 K/UL    Lymphocytes Absolute 1.2 0.5 - 4.6 K/UL    Monocytes Absolute 0.8 0.1 - 1.3 K/UL    Eosinophils Absolute 0.0 0.0 - 0.8 K/UL    Basophils Absolute 0.0 0.0 - 0.2 K/UL    Absolute Immature Granulocyte 0.2 0.0 - 0.5 K/UL   Comprehensive Metabolic Panel w/ Reflex to MG    Collection Time: 01/27/22  6:51 AM   Result Value Ref Range    Sodium 138 133 - 143 mmol/L    Potassium 5.0 3.5 - 5.1 mmol/L    Chloride 107 101 - 110 mmol/L    CO2 25 21 - 32 mmol/L    Anion Gap 6 2 - 11 mmol/L    Glucose 285 (H) 65 - 100 mg/dL    BUN 14 6 - 23 MG/DL    Creatinine 0.96 0.6 - 1.0 MG/DL    Est, Glom Filt Rate >60 >60 ml/min/1.40m    Calcium 9.0 8.3 - 10.4 MG/DL    Total Bilirubin 0.2 0.2 - 1.1 MG/DL    ALT 76 (H) 12 - 65 U/L    AST 29 15 - 37 U/L    Alk Phosphatase 115 50 - 130 U/L    Total Protein 6.9 6.3 - 8.2 g/dL    Albumin 3.2 (L) 3.5 - 5.0 g/dL    Globulin 3.7 2.8 - 4.5 g/dL  Albumin/Globulin Ratio 0.9 0.4 - 1.6     Phosphorus    Collection Time: 01/27/22  6:51 AM   Result Value Ref Range    Phosphorus 3.8 2.5 - 4.5 MG/DL   Hemoglobin A1C    Collection Time: 01/27/22  6:51 AM   Result Value Ref Range    Hemoglobin A1C 5.4 4.8 - 5.6 %    eAG 108 mg/dL   POCT Glucose    Collection Time: 01/27/22  7:31 AM   Result Value Ref Range    POC Glucose 269 (H) 65 - 100 mg/dL    Performed by: RavenellShakayaPCT    POCT Glucose    Collection Time: 01/27/22 11:42 AM   Result Value Ref Range    POC Glucose 372 (H) 65 - 100 mg/dL    Performed by: RavenellShakayaPCT        I have personally reviewed imaging studies:  XR CHEST PORTABLE    Result Date: 01/25/2022  Title: Chest Radiographs Indication:  Shortness of breath and  wheezing. Comparison: None. Findings:  Lungs/pleura:No evidence of consolidation or effusion Vascularity:Normal Vasculature Cardiac:Normal size cardiac silhouette Mediastinum:Normal Bones:No obvious acute fracture     No acute process          Signed:  Humberto Leep, MD    Part of this note may have been written by using a voice dictation software.  The note has been proof read but may still contain some grammatical/other typographical errors.

## 2022-01-27 NOTE — Plan of Care (Signed)
Problem: Discharge Planning  Goal: Discharge to home or other facility with appropriate resources  Outcome: Progressing     Problem: Pain  Goal: Verbalizes/displays adequate comfort level or baseline comfort level  Outcome: Progressing

## 2022-01-27 NOTE — Plan of Care (Signed)
Problem: Respiratory - Adult  Goal: Achieves optimal ventilation and oxygenation  Outcome: Progressing  Flowsheets (Taken 01/27/2022 2009)  Achieves optimal ventilation and oxygenation:   Assess for changes in respiratory status   Position to facilitate oxygenation and minimize respiratory effort   Initiate smoking cessation protocol as indicated   Assess the need for suctioning and aspirate as needed   Respiratory therapy support as indicated   Assess for changes in mentation and behavior   Oxygen supplementation based on oxygen saturation or arterial blood gases   Encourage broncho-pulmonary hygiene including cough, deep breathe, incentive spirometry   Assess and instruct to report shortness of breath or any respiratory difficulty

## 2022-01-27 NOTE — Progress Notes (Signed)
[facility]  Admission Date: 01/25/2022         Daily Progress Note: 01/27/2022    The patient's chart is reviewed and the patient is discussed with the staff.    Background: Patient is a 22 y.o. African American female seen and evaluated at the request of Dr. Jerelene Redden- Pachter for the evaluation of asthma exacerbation.She lost insurance and ran out of meds. Used to be followed [place]. Has multiple exacerbations a year. Usually on Adviar and Spiriva.Since yesterday increased cough, wheezing ,SOB  Admitted, feeling little better    Subjective:   On RA  Feels better   Likely home in AM    Current Facility-Administered Medications   Medication Dose Route Frequency    insulin lispro (HUMALOG) injection vial 2 Units  2 Units SubCUTAneous TID WC    albuterol (PROVENTIL) (2.5 MG/3ML) 0.083% nebulizer solution 2.5 mg  2.5 mg Nebulization Q6H WA RT    methylPREDNISolone sodium succ (SOLU-MEDROL) 40 mg in sterile water 1 mL injection  40 mg IntraVENous Q12H    morphine injection 1 mg  1 mg IntraVENous Once    insulin lispro (HUMALOG) injection vial 0-4 Units  0-4 Units SubCUTAneous TID WC    insulin lispro (HUMALOG) injection vial 0-4 Units  0-4 Units SubCUTAneous Nightly    budesonide (PULMICORT) nebulizer suspension 500 mcg  0.5 mg Nebulization BID RT    sodium chloride flush 0.9 % injection 5-40 mL  5-40 mL IntraVENous 2 times per day    sodium chloride flush 0.9 % injection 5-40 mL  5-40 mL IntraVENous PRN    0.9 % sodium chloride infusion   IntraVENous PRN    ondansetron (ZOFRAN-ODT) disintegrating tablet 4 mg  4 mg Oral Q8H PRN    Or    ondansetron (ZOFRAN) injection 4 mg  4 mg IntraVENous Q6H PRN    polyethylene glycol (GLYCOLAX) packet 17 g  17 g Oral Daily PRN    acetaminophen (TYLENOL) tablet 650 mg  650 mg Oral Q6H PRN    Or    acetaminophen (TYLENOL) suppository 650 mg  650 mg Rectal Q6H PRN    cetirizine (ZYRTEC) tablet 10 mg  10 mg Oral Daily    montelukast (SINGULAIR) tablet 10 mg  10 mg  Oral Nightly    pantoprazole (PROTONIX) tablet 40 mg  40 mg Oral QAM AC    morphine injection 2 mg  2 mg IntraVENous Once     Review of Systems: Comprehensive ROS negative except in HPI  Objective:   Blood pressure 126/71, pulse 88, temperature 98.4 F (36.9 C), temperature source Oral, resp. rate 17, height 5\' 8"  (1.727 m), weight 230 lb (104.3 kg), SpO2 98 %.   Intake/Output Summary (Last 24 hours) at 01/27/2022 1156  Last data filed at 01/27/2022 0915  Gross per 24 hour   Intake --   Output 2650 ml   Net -2650 ml     Physical Exam:   Constitutional:  the patient is well developed and in no acute distress  EENMT:  Sclera clear, pupils equal, oral mucosa moist  Respiratory: symmetric chest rise. Mid wheezing   Cardiovascular:  RRR without M,G,R. There is no lower extremity edema.  Gastrointestinal: soft and non-tender; with positive bowel sounds.  Musculoskeletal: warm without cyanosis. Normal muscle tone.   Skin:  no jaundice or rashes, no wounds   Neurologic: symmetric strength, fluent speech  Psychiatric:  calm, appropriate, oriented x 4    Imaging: I performed an  independent interpretation of the patient's images.  CXR:   8/8  clear         LAB:  Recent Labs     01/25/22  0737 01/26/22  0607 01/27/22  0651   WBC 12.5* 17.5* 22.0*   HGB 13.8 12.7 12.1   HCT 42.5 39.1 37.5   PLT 405 379 347     Recent Labs     01/25/22  0737 01/26/22  0607 01/27/22  0651   NA 139 137 138   K 4.2 4.7 5.0   CL 111* 108 107   CO2 23 20* 25   BUN 10 13 14    CREATININE 0.93 1.08* 0.96   PHOS  --   --  3.8   BILITOT 0.3 0.2 0.2   AST 47* 40* 29   ALT 65 69* 76*   ALKPHOS 134 127 115     Recent Labs     01/25/22  2137 01/26/22  0144 01/26/22  0607   TROPHS <3.0 <3.0 3.4  4.7     Recent Labs     01/25/22  0737 01/26/22  0607 01/27/22  0651   GLUCOSE 103* 252* 285*      Microbiology:   No results for input(s): CULTURE in the last 72 hours.  ECHO: No results found for this or any previous visit.    Assessment and Plan:  (Medical Decision  Making)     47 y old female admitted with asthma exacerbation ,after she ran out of her medicine, having multiple exacerbations yearly     Principal Problem:    Acute asthma exacerbation  Plan:   Patient has severe persistent asthma and would benefit from biologics- based on elevated eos count she qualified   Continue current regimen, will wean steroids some, will need outpatient follow up -she used to follow with [facility] ,she wish to [facility] to follow up at [facility]  Active Problems:    GERD (gastroesophageal reflux disease)  Plan:     Allergic rhinitis  Plan:       Wean steroids, she is Ok to discharge tomorrow, prednisone taper over 7-10 days  continue Advair and Spiriva, Singulair and follow up with [facility] - she would benefit from biologics     More than 50% of the time documented was spent in face-to-face contact with the patient and in the care of the patient on the floor/unit where the patient is located.    [facility], MD

## 2022-01-28 LAB — COMPREHENSIVE METABOLIC PANEL W/ REFLEX TO MG FOR LOW K
ALT: 76 U/L — ABNORMAL HIGH (ref 12–65)
AST: 24 U/L (ref 15–37)
Albumin/Globulin Ratio: 0.8 (ref 0.4–1.6)
Albumin: 3.1 g/dL — ABNORMAL LOW (ref 3.5–5.0)
Alk Phosphatase: 125 U/L (ref 50–136)
Anion Gap: 5 mmol/L (ref 2–11)
BUN: 19 MG/DL (ref 6–23)
CO2: 26 mmol/L (ref 21–32)
Calcium: 8.8 MG/DL (ref 8.3–10.4)
Chloride: 107 mmol/L (ref 101–110)
Creatinine: 0.97 MG/DL (ref 0.6–1.0)
Est, Glom Filt Rate: >60 mL/min/{1.73_m2} (ref >60)
Globulin: 3.8 g/dL (ref 2.8–4.5)
Glucose: 298 mg/dL — ABNORMAL HIGH (ref 65–100)
Potassium: 4.2 mmol/L (ref 3.5–5.1)
Sodium: 138 mmol/L (ref 133–143)
Total Bilirubin: 0.1 MG/DL — ABNORMAL LOW (ref 0.2–1.1)
Total Protein: 6.9 g/dL (ref 6.3–8.2)

## 2022-01-28 LAB — CBC WITH AUTO DIFFERENTIAL
Absolute Immature Granulocyte: 0.2 10*3/uL (ref 0.0–0.5)
Basophils %: 0 % (ref 0.0–2.0)
Basophils Absolute: 0 10*3/uL (ref 0.0–0.2)
Eosinophils %: 0 % — ABNORMAL LOW (ref 0.5–7.8)
Eosinophils Absolute: 0 10*3/uL (ref 0.0–0.8)
Hematocrit: 38.5 % (ref 35.8–46.3)
Hemoglobin: 12.4 g/dL (ref 11.7–15.4)
Immature Granulocytes: 1 % (ref 0.0–5.0)
Lymphocytes %: 14 % (ref 13–44)
Lymphocytes Absolute: 2.1 10*3/uL (ref 0.5–4.6)
MCH: 30.8 PG (ref 26.1–32.9)
MCHC: 32.2 g/dL (ref 31.4–35.0)
MCV: 95.8 FL (ref 82.0–102.0)
MPV: 10.7 FL (ref 9.4–12.3)
Monocytes %: 9 % (ref 4.0–12.0)
Monocytes Absolute: 1.3 10*3/uL (ref 0.1–1.3)
Neutrophils %: 76 % (ref 43–78)
Neutrophils Absolute: 11.4 10*3/uL — ABNORMAL HIGH (ref 1.7–8.2)
Platelets: 340 10*3/uL (ref 150–450)
RBC: 4.02 M/uL — ABNORMAL LOW (ref 4.05–5.2)
RDW: 12.2 % (ref 11.9–14.6)
WBC: 15 10*3/uL — ABNORMAL HIGH (ref 4.3–11.1)
nRBC: 0 10*3/uL (ref 0.0–0.2)

## 2022-01-28 LAB — POCT GLUCOSE
POC Glucose: 336 mg/dL — ABNORMAL HIGH (ref 65–100)
POC Glucose: 259 mg/dL — ABNORMAL HIGH (ref 65–100)
POC Glucose: 346 mg/dL — ABNORMAL HIGH (ref 65–100)
POC Glucose: 262 mg/dL — ABNORMAL HIGH (ref 65–100)

## 2022-01-28 LAB — IGE: IgE: 1016 IU/mL — ABNORMAL HIGH (ref 6–495)

## 2022-01-28 LAB — PHOSPHORUS: Phosphorus: 3.3 MG/DL (ref 2.5–4.5)

## 2022-01-28 MED ORDER — PREDNISONE 20 MG PO TABS
20 MG | Freq: Every day | ORAL | Status: AC
Start: 2022-01-28 — End: 2022-01-31
  Administered 2022-01-28 – 2022-01-29 (×2): 40 mg via ORAL

## 2022-01-28 MED ORDER — INSULIN NPH (HUMAN) (ISOPHANE) 100 UNIT/ML SC SUSP
100 UNIT/ML | Freq: Every day | SUBCUTANEOUS | Status: AC
Start: 2022-01-28 — End: 2022-01-29
  Administered 2022-01-28: 13:00:00 20 [IU] via SUBCUTANEOUS

## 2022-01-28 MED FILL — HUMALOG 100 UNIT/ML IJ SOLN: 100 UNIT/ML | INTRAMUSCULAR | Qty: 2

## 2022-01-28 MED FILL — PREDNISONE 20 MG PO TABS: 20 MG | ORAL | Qty: 2

## 2022-01-28 MED FILL — ALBUTEROL SULFATE (2.5 MG/3ML) 0.083% IN NEBU: RESPIRATORY_TRACT | Qty: 3

## 2022-01-28 MED FILL — BUDESONIDE 0.5 MG/2ML IN SUSP: 0.5 MG/2ML | RESPIRATORY_TRACT | Qty: 2

## 2022-01-28 MED FILL — PANTOPRAZOLE SODIUM 40 MG PO TBEC: 40 MG | ORAL | Qty: 1

## 2022-01-28 MED FILL — BUDESONIDE 0.5 MG/2ML IN SUSP: 0.5 MG/2ML | RESPIRATORY_TRACT | Qty: 4

## 2022-01-28 MED FILL — MONTELUKAST SODIUM 10 MG PO TABS: 10 MG | ORAL | Qty: 1

## 2022-01-28 MED FILL — HUMALOG 100 UNIT/ML IJ SOLN: 100 UNIT/ML | INTRAMUSCULAR | Qty: 3

## 2022-01-28 MED FILL — NOVOLIN N 100 UNIT/ML SC SUSP: 100 UNIT/ML | SUBCUTANEOUS | Qty: 20

## 2022-01-28 MED FILL — METHYLPREDNISOLONE SODIUM SUCC 40 MG IJ SOLR: 40 MG | INTRAMUSCULAR | Qty: 40

## 2022-01-28 MED FILL — CETIRIZINE HCL 10 MG PO TABS: 10 MG | ORAL | Qty: 1

## 2022-01-28 NOTE — Progress Notes (Signed)
Hospitalist progress note   Admit Date:  01/25/2022  7:25 AM   Name:  Ann Medina   Age:  21 y.o.  Sex:  female  DOB:  2001-05-07   MRN:  [id number]   Room:  340/01    Presenting/Chief Complaint: Shortness of Breath     Reason(s) for Admission: Shortness of breath [R06.02]  Acute asthma exacerbation [J45.901]  Acute exacerbation of extrinsic asthma [J45.901]     History of Present Illness:       Ann Medina is a 21 y.o. female with medical history of asthma, allergies, GERD who is evaluated with shortness of breath/ wheezing.      She has been followed by [facility] but insurance lapsed.  Ran out of her meds this morning.     Had increased shortness of breath/wheezing since yesterday.  No fever  Feeling a little better after ER treatment.       Gets short of breath walking and talking      FULL CODE  Mother is Claudette Laws [phone]    Subjective  Patient was seen and examined bedside. She was laying comfortably on the bed. She was saturating well on room air and was off of oxygen.  Patient denies chest pain, shortness of breath, nausea, vomiting or diarrhea.    Assessment & Plan:   Acute hypoxic respiratory failure due to severe persistent    Acute asthma exacerbation  Patient is saturating well on RA  Baseline not on any home oxygen   Chest x-ray normal  ABG showed respiratory alkalosis  Discontinue IV solumedrol 40 mg every 12 hours  switched to p.o. prednisone and multiple  Continue with Advair, Spiriva, Singulair  Plan to start [facility] as outpatient and follow-up with [facility]  Follow-up with RT for home oxygen assessment prior to discharge  Pulmonary signed off, appreciate recommendations    Atypical chest pain  Most likely musculoskeletal in nature  EKG normal sinus rhythm  Troponins and D-dimer negative  CT chest negative for PE   echo with normal ejection fraction  Pain management with tramadol  No further further cardiac evaluation is needed  Cardiology signed off, patient  recommendations    Uncontrolled hyperglycemia in the setting of steroids  Blood glucose ranging around 300s   HbA1c 5  Increased NPH 20 units  Continue ISS  Diabetes educator consulted, appreciate conditions    Leukocytosis of 15 K in the setting of steroids.      GERD (gastroesophageal reflux disease)  PPI          Allergic rhinitis  Zyrtec, singulair      Elevated AST:  Resolved      PT/OT evals and PPD needed/ordered?  No  Diet: ADULT DIET; Regular  VTE prophylaxis: SCD's   Code status: Full Code    Hospital Problems:  Principal Problem:    Acute asthma exacerbation  Active Problems:    GERD (gastroesophageal reflux disease)    Allergic rhinitis    Shortness of breath    Non-cardiac chest pain  Resolved Problems:    * No resolved hospital problems. *       Past History:     Past Medical History:   Diagnosis Date    Allergic rhinitis     Aspergillosis (HCC)     Asthma     Asthma     GERD (gastroesophageal reflux disease)     Molluscum contagiosum        PSH- none  Social History     Tobacco Use    Smoking status: Never    Smokeless tobacco: Not on file   Substance Use Topics    Alcohol use: rare      Social History     Substance and Sexual Activity   Drug Use No       Family History   Problem Relation Age of Onset    Other Other         CEREBROVASCULAR ACCIDENT    Hypertension Other     Coronary Art Dis Other     Diabetes Other         Immunization History   Administered Date(s) Administered    DTaP vaccine 02/15/1999, 04/21/1999, 06/22/1999, 12/18/2000, 11/12/2003    Hepatitis B vaccine 23-Dec-1998, 06/22/1999, 09/16/1999    Hib vaccine 02/15/1999, 04/21/1999, 06/22/1999, 05/03/2000    Influenza Virus Vaccine 08/10/2006, 02/28/2008    MMR, PRIORIX, M-M-R II, (age 71m), SC, 0.533m11/14/2001, 11/12/2003    Pneumococcal, PCV-13, PREVNAR 1363(age 6w+), IM, 0.96m20m8/28/2000, 04/21/1999, 06/22/1999, 05/02/2000    Poliovirus, IPOL, (age 6w+), SC/IM, 0.96mL56m/28/2000, 04/21/1999, 09/16/1999, 11/12/2003    Varicella,  VARIVAX, (age 9m+42mC, 0.96mL 166m4/2001, 01/08/2009     Allergies   Allergen Reactions    Peanut Oil Other (See Comments)    Montelukast Rash     States not allergic to this med     Prior to Admit Medications:  Current Outpatient Medications   Medication Instructions    albuterol sulfate HFA (PROVENTIL HFA) 108 (90 Base) MCG/ACT inhaler 2 puffs, Inhalation, EVERY 6 HOURS PRN    budesonide (PULMICORT) 500 mcg    budesonide-formoterol (SYMBICORT) 80-4.5 MCG/ACT AERO Inhale into the lungs    cetirizine (ZYRTEC) 10 mg, Oral, DAILY    fluticasone (FLONASE) 50 MCG/ACT nasal spray 2 sprays    fluticasone-salmeterol (ADVAIR DISKUS) 250-50 MCG/ACT AEPB diskus inhaler 1 puff, Inhalation, EVERY 12 HOURS    levalbuterol (XOPENEX) 0.31 mg, EVERY 4 HOURS PRN    montelukast (SINGULAIR) 10 mg, Oral, DAILY    tiotropium (SPIRIVA) 18 MCG inhalation capsule 1 capsule, Inhalation, DAILY         Objective:   Patient Vitals for the past 24 hrs:   Temp Pulse Resp BP SpO2   01/28/22 0759 -- 62 16 -- 95 %   01/28/22 0741 98.3 F (36.8 C) 70 16 115/73 97 %   01/27/22 2038 97.9 F (36.6 C) (!) 103 18 (!) 150/86 97 %   01/27/22 2006 -- 79 18 -- 97 %       Oxygen Therapy  SpO2: 95 %  Pulse via Oximetry: 79 beats per minute  Pulse Oximeter Device Mode: Continuous  Pulse Oximeter Device Location: Right, Finger  O2 Device: None (Room air)  Skin Assessment: Clean, dry, & intact  Skin Protection for O2 Device: No  O2 Flow Rate (L/min): 2 L/min    Estimated body mass index is 34.97 kg/m as calculated from the following:    Height as of this encounter: '5\' 8"'$  (1.727 m).    Weight as of this encounter: 230 lb (104.3 kg).    Intake/Output Summary (Last 24 hours) at 01/28/2022 1447  Last data filed at 01/27/2022 2256  Gross per 24 hour   Intake --   Output 1100 ml   Net -1100 ml         Physical Exam:    General:    Well nourished.  Alert, some shortness of breath talking, audible expiratory  wheezing   Head:  Normocephalic, atraumatic  Eyes:  Sclerae  appear normal.  Pupils equally round.  ENT:  Nares appear normal.  Moist oral mucosa  Neck:  No restricted ROM.  Trachea midline   CV:   RRR.  No m/r/g.  No jugular venous distension. No edema   Lungs:   Diffuse expiratory wheezing  Abdomen:   Soft, nontender, nondistended.  Extremities: No cyanosis or clubbing.  No edema  Skin:     No rashes and normal coloration.   Warm and dry.    Neuro:   grossly intact.    Psych:  Normal mood and affect.      Orders Placed This Encounter   Medications    dexamethasone (DECADRON) injection 10 mg    albuterol (PROVENTIL) (2.5 MG/3ML) 0.083% nebulizer solution     Order Specific Question:   Initiate RT Bronchodilator Protocol     Answer:   No    magnesium sulfate 2000 mg in 50 mL IVPB premix    ipratropium (ATROVENT) 0.02 % nebulizer solution 0.5 mg     Order Specific Question:   Initiate RT Bronchodilator Protocol     Answer:   No    albuterol (PROVENTIL) (2.5 MG/3ML) 0.083% nebulizer solution     Leafy Ro: cabinet override    albuterol (PROVENTIL) (2.5 MG/3ML) 0.083% nebulizer solution 10 mg     Order Specific Question:   Initiate RT Bronchodilator Protocol     Answer:   Yes - Inpatient Protocol    predniSONE (DELTASONE) tablet 40 mg    ipratropium 0.5 mg-albuterol 2.5 mg (DUONEB) nebulizer solution 1 Dose     Order Specific Question:   Initiate RT Bronchodilator Protocol     Answer:   No    DISCONTD: albuterol (PROVENTIL) (2.5 MG/3ML) 0.083% nebulizer solution 2.5 mg     Order Specific Question:   Initiate RT Bronchodilator Protocol     Answer:   Yes - Inpatient Protocol    budesonide (PULMICORT) nebulizer suspension 500 mcg    DISCONTD: methylPREDNISolone sodium succ (SOLU-MEDROL) 60 mg in sterile water 0.96 mL injection    sodium chloride flush 0.9 % injection 5-40 mL    sodium chloride flush 0.9 % injection 5-40 mL    0.9 % sodium chloride infusion    OR Linked Order Group     ondansetron (ZOFRAN-ODT) disintegrating tablet 4 mg     ondansetron (ZOFRAN) injection 4 mg     polyethylene glycol (GLYCOLAX) packet 17 g    OR Linked Order Group     acetaminophen (TYLENOL) tablet 650 mg     acetaminophen (TYLENOL) suppository 650 mg    cetirizine (ZYRTEC) tablet 10 mg    montelukast (SINGULAIR) tablet 10 mg    pantoprazole (PROTONIX) tablet 40 mg    morphine injection 2 mg    ibuprofen (ADVIL;MOTRIN) tablet 400 mg    morphine injection 1 mg    traMADol (ULTRAM) tablet 25 mg    DISCONTD: methylPREDNISolone sodium succ (SOLU-MEDROL) 60 mg in sterile water 0.96 mL injection    iopamidol (ISOVUE-370) 76 % injection 100 mL    sodium chloride flush 0.9 % injection 10 mL    sodium chloride 0.9 % bolus 100 mL    insulin lispro (HUMALOG) injection vial 0-4 Units    insulin lispro (HUMALOG) injection vial 0-4 Units    DISCONTD: insulin lispro (HUMALOG) injection vial 2 Units    albuterol (PROVENTIL) (2.5 MG/3ML) 0.083% nebulizer solution 2.5 mg  Order Specific Question:   Initiate RT Bronchodilator Protocol     Answer:   Yes - Inpatient Protocol    DISCONTD: methylPREDNISolone sodium succ (SOLU-MEDROL) 40 mg in sterile water 1 mL injection    DISCONTD: insulin NPH (HumuLIN N;NovoLIN N) injection vial 10 Units    insulin NPH (HumuLIN N;NovoLIN N) injection vial 20 Units    predniSONE (DELTASONE) tablet 40 mg       I have personally reviewed labs and tests:  Recent Labs:  Recent Results (from the past 24 hour(s))   POCT Glucose    Collection Time: 01/27/22  4:42 PM   Result Value Ref Range    POC Glucose 298 (H) 65 - 100 mg/dL    Performed by: RavenellShakayaPCT    POCT Glucose    Collection Time: 01/27/22  8:39 PM   Result Value Ref Range    POC Glucose 336 (H) 65 - 100 mg/dL    Performed by: PritchardAniquaPCT    CBC with Auto Differential    Collection Time: 01/28/22  5:34 AM   Result Value Ref Range    WBC 15.0 (H) 4.3 - 11.1 K/uL    RBC 4.02 (L) 4.05 - 5.2 M/uL    Hemoglobin 12.4 11.7 - 15.4 g/dL    Hematocrit 38.5 35.8 - 46.3 %    MCV 95.8 82.0 - 102.0 FL    MCH 30.8 26.1 - 32.9 PG    MCHC  32.2 31.4 - 35.0 g/dL    RDW 12.2 11.9 - 14.6 %    Platelets 340 150 - 450 K/uL    MPV 10.7 9.4 - 12.3 FL    nRBC 0.00 0.0 - 0.2 K/uL    Differential Type AUTOMATED      Neutrophils % 76 43 - 78 %    Lymphocytes % 14 13 - 44 %    Monocytes % 9 4.0 - 12.0 %    Eosinophils % 0 (L) 0.5 - 7.8 %    Basophils % 0 0.0 - 2.0 %    Immature Granulocytes 1 0.0 - 5.0 %    Neutrophils Absolute 11.4 (H) 1.7 - 8.2 K/UL    Lymphocytes Absolute 2.1 0.5 - 4.6 K/UL    Monocytes Absolute 1.3 0.1 - 1.3 K/UL    Eosinophils Absolute 0.0 0.0 - 0.8 K/UL    Basophils Absolute 0.0 0.0 - 0.2 K/UL    Absolute Immature Granulocyte 0.2 0.0 - 0.5 K/UL   Comprehensive Metabolic Panel w/ Reflex to MG    Collection Time: 01/28/22  5:34 AM   Result Value Ref Range    Sodium 138 133 - 143 mmol/L    Potassium 4.2 3.5 - 5.1 mmol/L    Chloride 107 101 - 110 mmol/L    CO2 26 21 - 32 mmol/L    Anion Gap 5 2 - 11 mmol/L    Glucose 298 (H) 65 - 100 mg/dL    BUN 19 6 - 23 MG/DL    Creatinine 0.97 0.6 - 1.0 MG/DL    Est, Glom Filt Rate >60 >60 ml/min/1.18m    Calcium 8.8 8.3 - 10.4 MG/DL    Total Bilirubin 0.1 (L) 0.2 - 1.1 MG/DL    ALT 76 (H) 12 - 65 U/L    AST 24 15 - 37 U/L    Alk Phosphatase 125 50 - 136 U/L    Total Protein 6.9 6.3 - 8.2 g/dL    Albumin 3.1 (L) 3.5 - 5.0 g/dL  Globulin 3.8 2.8 - 4.5 g/dL    Albumin/Globulin Ratio 0.8 0.4 - 1.6     Phosphorus    Collection Time: 01/28/22  5:34 AM   Result Value Ref Range    Phosphorus 3.3 2.5 - 4.5 MG/DL   POCT Glucose    Collection Time: 01/28/22  7:25 AM   Result Value Ref Range    POC Glucose 259 (H) 65 - 100 mg/dL    Performed by: BridgesEmilyPCT    POCT Glucose    Collection Time: 01/28/22 11:07 AM   Result Value Ref Range    POC Glucose 346 (H) 65 - 100 mg/dL    Performed by: BridgesEmilyPCT        I have personally reviewed imaging studies:  XR CHEST PORTABLE    Result Date: 01/25/2022  Title: Chest Radiographs Indication:  Shortness of breath and wheezing. Comparison: None. Findings:   Lungs/pleura:No evidence of consolidation or effusion Vascularity:Normal Vasculature Cardiac:Normal size cardiac silhouette Mediastinum:Normal Bones:No obvious acute fracture     No acute process          Signed:  Humberto Leep, MD    Part of this note may have been written by using a voice dictation software.  The note has been proof read but may still contain some grammatical/other typographical errors.

## 2022-01-28 NOTE — Care Coordination-Inpatient (Signed)
Chart reviewed and patient discussed in IDT rounds this AM.  Patient possibly discharging home today. Patient weaned off o2. No discharge needs.    Nancy Marus LBSW, ACM  [facility] Case Manager

## 2022-01-28 NOTE — Progress Notes (Signed)
01/28/22 1131   Resting (Room Air)   SpO2 99   HR 68   Resting (On O2)   SpO2   (NA)   During Walk (Room Air)   SpO2 99   HR 114   Walk/Assistance Device Ambulation   Rate of Dyspnea 0   During Walk (On O2)   SpO2   (NA)   Need Additional O2 Flow Rate Rows No   After Walk   SpO2 97   HR 98   Does the Patient Qualify for Home O2 No   Does the Patient Need Portable Oxygen Tanks No

## 2022-01-28 NOTE — Other (Addendum)
Patient admitted with acute asthma exacerbation. Blood glucose ranged 269-372 yesterday with patient receiving Humalog 12 units, NPH 10 units, and Solumedrol 100mg . Blood glucose this morning was 259. Creatinine 0.97. GFR >60. Reviewed patient current regimen: NPH 10 units daily, Humalog correctional insulin, and Solumedrol 40mg  q12h. Patient would likely benefit from an increase in intermediate acting insulin to help offset hyperglycemia related to steroid therapy. Provider updated via [employer] regarding recommendations and patient glycemic control. As steroid therapy is weaned patient insulin needs will likely decrease.    Update at 0948: per provider patient to discharge on prednisone taper. Spoke with patient reviewed A1c and blood glucose levels. Patient receiving steroid therapy. Explained relationship between steroids and hyperglycemia to patient and educated patient that as steroids are tapered their glycemic control will likely improve and insulin needs will likely decrease as well. Encouraged patient to continue to work on lifestyle modifications and to follow up with primary care provider (per chart review CM provided information regarding [place] [facility]) for further monitoring of A1c. Discussed benefits of weight loss and avoiding sweetened beverages. Encouraged portion control and increase in physical activity once approved by provider to reduce risk of developing type 2 diabetes. Patient has no questions regarding glycemic control.

## 2022-01-29 LAB — CBC WITH AUTO DIFFERENTIAL
Absolute Immature Granulocyte: 0.2 10*3/uL (ref 0.0–0.5)
Basophils %: 0 % (ref 0.0–2.0)
Basophils Absolute: 0 10*3/uL (ref 0.0–0.2)
Eosinophils %: 1 % (ref 0.5–7.8)
Eosinophils Absolute: 0.1 10*3/uL (ref 0.0–0.8)
Hematocrit: 39 % (ref 35.8–46.3)
Hemoglobin: 13 g/dL (ref 11.7–15.4)
Immature Granulocytes: 1 % (ref 0.0–5.0)
Lymphocytes %: 26 % (ref 13–44)
Lymphocytes Absolute: 4 10*3/uL (ref 0.5–4.6)
MCH: 31 PG (ref 26.1–32.9)
MCHC: 33.3 g/dL (ref 31.4–35.0)
MCV: 92.9 FL (ref 82.0–102.0)
MPV: 10.4 FL (ref 9.4–12.3)
Monocytes %: 8 % (ref 4.0–12.0)
Monocytes Absolute: 1.3 10*3/uL (ref 0.1–1.3)
Neutrophils %: 63 % (ref 43–78)
Neutrophils Absolute: 9.6 10*3/uL — ABNORMAL HIGH (ref 1.7–8.2)
Platelets: 323 10*3/uL (ref 150–450)
RBC: 4.2 M/uL (ref 4.05–5.2)
RDW: 12.1 % (ref 11.9–14.6)
WBC: 15.2 10*3/uL — ABNORMAL HIGH (ref 4.3–11.1)
nRBC: 0 10*3/uL (ref 0.0–0.2)

## 2022-01-29 LAB — POCT GLUCOSE
POC Glucose: 399 mg/dL — ABNORMAL HIGH (ref 65–100)
POC Glucose: 141 mg/dL — ABNORMAL HIGH (ref 65–100)

## 2022-01-29 LAB — COMPREHENSIVE METABOLIC PANEL W/ REFLEX TO MG FOR LOW K
ALT: 67 U/L — ABNORMAL HIGH (ref 12–65)
AST: 19 U/L (ref 15–37)
Albumin/Globulin Ratio: 0.8 (ref 0.4–1.6)
Albumin: 3.2 g/dL — ABNORMAL LOW (ref 3.5–5.0)
Alk Phosphatase: 121 U/L (ref 50–136)
Anion Gap: 5 mmol/L (ref 2–11)
BUN: 16 MG/DL (ref 6–23)
CO2: 27 mmol/L (ref 21–32)
Calcium: 8.6 MG/DL (ref 8.3–10.4)
Chloride: 105 mmol/L (ref 101–110)
Creatinine: 0.84 MG/DL (ref 0.6–1.0)
Est, Glom Filt Rate: >60 mL/min/{1.73_m2} (ref >60)
Globulin: 3.8 g/dL (ref 2.8–4.5)
Glucose: 195 mg/dL — ABNORMAL HIGH (ref 65–100)
Potassium: 3.6 mmol/L (ref 3.5–5.1)
Sodium: 137 mmol/L (ref 133–143)
Total Bilirubin: 0.2 MG/DL (ref 0.2–1.1)
Total Protein: 7 g/dL (ref 6.3–8.2)

## 2022-01-29 LAB — PHOSPHORUS: Phosphorus: 3.6 MG/DL (ref 2.5–4.5)

## 2022-01-29 MED ORDER — ALBUTEROL SULFATE HFA 108 (90 BASE) MCG/ACT IN AERS
108 (90 Base) MCG/ACT | Freq: Four times a day (QID) | RESPIRATORY_TRACT | 2 refills | Status: AC | PRN
Start: 2022-01-29 — End: ?

## 2022-01-29 MED ORDER — MONTELUKAST SODIUM 10 MG PO TABS
10 MG | ORAL_TABLET | Freq: Every day | ORAL | 3 refills | Status: DC
Start: 2022-01-29 — End: 2022-01-29

## 2022-01-29 MED ORDER — BUDESONIDE 0.5 MG/2ML IN SUSP
0.5 MG/2ML | Freq: Two times a day (BID) | RESPIRATORY_TRACT | 3 refills | Status: DC
Start: 2022-01-29 — End: 2023-12-12

## 2022-01-29 MED ORDER — MONTELUKAST SODIUM 10 MG PO TABS
10 MG | ORAL_TABLET | Freq: Every day | ORAL | 0 refills | Status: AC
Start: 2022-01-29 — End: 2022-02-28

## 2022-01-29 MED ORDER — PANTOPRAZOLE SODIUM 40 MG PO TBEC
40 MG | ORAL_TABLET | Freq: Every day | ORAL | 0 refills | Status: DC
Start: 2022-01-29 — End: 2023-12-12

## 2022-01-29 MED ORDER — FLUTICASONE-SALMETEROL 250-50 MCG/ACT IN AEPB
250-50 MCG/ACT | Freq: Two times a day (BID) | RESPIRATORY_TRACT | 3 refills | Status: DC
Start: 2022-01-29 — End: 2023-12-12

## 2022-01-29 MED ORDER — LEVALBUTEROL HCL 0.31 MG/3ML IN NEBU
0.31 | RESPIRATORY_TRACT | 2.00 refills | 25.00000 days | Status: DC | PRN
Start: 2022-01-29 — End: 2024-02-29

## 2022-01-29 MED ORDER — CETIRIZINE HCL 10 MG PO TABS
10 MG | ORAL_TABLET | Freq: Every day | ORAL | 0 refills | Status: AC
Start: 2022-01-29 — End: 2022-02-28

## 2022-01-29 MED ORDER — BUDESONIDE-FORMOTEROL FUMARATE 80-4.5 MCG/ACT IN AERO
80-4.5 MCG/ACT | Freq: Every day | RESPIRATORY_TRACT | 0 refills | Status: DC
Start: 2022-01-29 — End: 2023-12-12

## 2022-01-29 MED ORDER — PREDNISONE 20 MG PO TABS
20 MG | ORAL_TABLET | Freq: Every day | ORAL | 0 refills | Status: AC
Start: 2022-01-29 — End: 2022-01-31

## 2022-01-29 MED ORDER — TIOTROPIUM BROMIDE MONOHYDRATE 18 MCG IN CAPS
18 MCG | ORAL_CAPSULE | Freq: Every day | RESPIRATORY_TRACT | 3 refills | Status: DC
Start: 2022-01-29 — End: 2023-12-12

## 2022-01-29 MED FILL — PANTOPRAZOLE SODIUM 40 MG PO TBEC: 40 MG | ORAL | Qty: 1

## 2022-01-29 MED FILL — BUDESONIDE 0.5 MG/2ML IN SUSP: 0.5 MG/2ML | RESPIRATORY_TRACT | Qty: 2

## 2022-01-29 MED FILL — PREDNISONE 20 MG PO TABS: 20 MG | ORAL | Qty: 2

## 2022-01-29 MED FILL — MONTELUKAST SODIUM 10 MG PO TABS: 10 MG | ORAL | Qty: 1

## 2022-01-29 MED FILL — ALBUTEROL SULFATE (2.5 MG/3ML) 0.083% IN NEBU: RESPIRATORY_TRACT | Qty: 3

## 2022-01-29 MED FILL — HUMALOG 100 UNIT/ML IJ SOLN: 100 UNIT/ML | INTRAMUSCULAR | Qty: 4

## 2022-01-29 MED FILL — CETIRIZINE HCL 10 MG PO TABS: 10 MG | ORAL | Qty: 1

## 2022-01-29 NOTE — Progress Notes (Signed)
Discharge instructions reviewed with Pt. Opportunity for questions and clarification given. IV removed and cath tip intact. Scripts sent to pharmacy / sent with pt. Education given to pt and pt was able to teach back. No needs at this time and pt waiting on ride to arrive.

## 2022-01-29 NOTE — Progress Notes (Addendum)
Hospitalist Discharge Summary   Admit Date:  01/25/2022  7:25 AM   DC Note date: 01/29/2022  Name:  Ann Medina   Age:  21 y.o.  Sex:  female  DOB:  07/18/2000   MRN:  [id number]   Room:  340/01  PCP:  On File Not (Inactive)    Presenting Complaint: Shortness of Breath     Initial Admission Diagnosis: Shortness of breath [R06.02]  Acute asthma exacerbation [J45.901]  Acute exacerbation of extrinsic asthma [J45.901]     Problem List for this Hospitalization (present on admission):    Principal Problem:    Acute asthma exacerbation  Active Problems:    GERD (gastroesophageal reflux disease)    Allergic rhinitis    Shortness of breath    Non-cardiac chest pain  Resolved Problems:    * No resolved hospital problems. *      Hospital Course:     Ann Medina is a 21 y.o. female with medical history of asthma, allergies, GERD who is evaluated with shortness of breath/ wheezing. Had increased shortness of breath/wheezing. She has been followed by [facility] but insurance lapsed. Ran out of her meds. Gets short of breath walking and speaking. She was treated with IV steroids and inhalers. Pulmonary consulted. She was on [facility] and weaned off oxygen. She is saturating well on RA. Her wheezing improved. Transitioned to PO steroids. Her blood glucose was high due to steroids and was given insulin. Pt is hemodynamically stable for discharge.     Disposition: Home  Diet: ADULT DIET; Regular  Code Status: Full Code    Follow Ups:   Follow-up Information     [facility] Schedule an appointment as soon as possible for a   visit in 1 week(s).    Contact information:  [place]  [phone]                 Time spent in patient discharge and coordination 39 minutes.        Follow up labs/diagnostics (ultimately defer to outpatient provider):  Follow up with PCP in 3-5 days   Follow up with Pulmonology in 2 weeks at [facility] was discussed with patient.  All  questions answered.  Patient was stable at time of discharge.  Instructions given to call a physician or return if any concerns.    Current Discharge Medication List        START taking these medications    Details   predniSONE (DELTASONE) 20 MG tablet Take 2 tablets by mouth daily for 2 doses  Qty: 4 tablet, Refills: 0      pantoprazole (PROTONIX) 40 MG tablet Take 1 tablet by mouth every morning (before breakfast)  Qty: 30 tablet, Refills: 0           CONTINUE these medications which have CHANGED    Details   albuterol sulfate HFA (PROVENTIL HFA) 108 (90 Base) MCG/ACT inhaler Inhale 2 puffs into the lungs every 6 hours as needed for Wheezing  Qty: 18 g, Refills: 2      budesonide-formoterol (SYMBICORT) 80-4.5 MCG/ACT AERO Inhale 2 puffs into the lungs daily  Qty: 10.2 g, Refills: 0      fluticasone-salmeterol (ADVAIR DISKUS) 250-50 MCG/ACT AEPB diskus inhaler Inhale 1 puff into the lungs in the morning and 1 puff in the evening.  Qty: 60 each, Refills: 3      montelukast (  SINGULAIR) 10 MG tablet Take 1 tablet by mouth daily  Qty: 30 tablet, Refills: 3      tiotropium (SPIRIVA) 18 MCG inhalation capsule Inhale 1 capsule into the lungs daily  Qty: 30 capsule, Refills: 3           CONTINUE these medications which have NOT CHANGED    Details   cetirizine (ZYRTEC) 10 MG tablet Take 1 tablet by mouth daily      budesonide (PULMICORT) 0.5 MG/2ML nebulizer suspension Inhale 500 mcg into the lungs      fluticasone (FLONASE) 50 MCG/ACT nasal spray 2 sprays by Nasal route      levalbuterol (XOPENEX) 0.31 MG/3ML nebulization Inhale 0.31 mg into the lungs every 4 hours as needed             Some medications may have been reported old/obsolete and marked "stop taking" by the system; in reality pt was already off these meds; defer to outpatient or prescribing providers.    Procedures done this admission:  * No surgery found *    Consults this admission:  IP CONSULT TO PULMONOLOGY  IP CONSULT TO CARDIOLOGY  IP CONSULT TO DIABETES  MANAGEMENT    Echocardiogram results:  01/25/22    TRANSTHORACIC ECHOCARDIOGRAM (TTE) COMPLETE (CONTRAST/BUBBLE/3D PRN) 01/26/2022 10:41 AM (Final)    Interpretation Summary    Left Ventricle: Normal left ventricular systolic function. EF by 2D Simpsons Biplane is 63%. Left ventricle size is normal. Mildly increased wall thickness. Normal wall motion. Normal diastolic function. Average E/e' ratio is 7.42.    Right Ventricle: Right ventricle size is normal. Normal systolic function.    Mitral Valve: Mild regurgitation.    Tricuspid Valve: Trace regurgitation.    Signed by: Walker Kehr, DO on 01/26/2022 10:41 AM      Diagnostic Imaging/Tests:   XR CHEST PORTABLE    Result Date: 01/25/2022  No acute process      CT CHEST PULMONARY EMBOLISM W CONTRAST    Result Date: 01/26/2022  1. No acute cardiopulmonary abnormality. Specifically, no acute pulmonary emboli.        Labs: Results:       BMP, Mg, Phos Recent Labs     01/27/22  0651 01/28/22  0534 01/29/22  0520   NA 138 138 137   K 5.0 4.2 3.6   CL 107 107 105   CO2 '25 26 27   ' ANIONGAP '6 5 5   ' BUN '14 19 16   ' CREATININE 0.96 0.97 0.84   LABGLOM >60 >60 >60   CALCIUM 9.0 8.8 8.6   GLUCOSE 285* 298* 195*   PHOS 3.8 3.3 3.6      CBC '@LABRCNT' (WBC:3,RBC:3,HGB:3,HCT:3,MCV:3,MCH:3,MCHC:3,RDW:3,PLT:3,MPV:3,NRBC:3,SEGS:3,LYMPHOPCT:3,EOSRELPCT:3,MONOPCT:3,BASOPCT:3,IMMGRAN:3,SEGSABS:3,LYMPHSABS:3,EOSABS:3,MONOSABS:3,BASOSABS:3,ABSIMMGRAN:3)@   LFT Recent Labs     01/27/22  0651 01/28/22  0534 01/29/22  0520   BILITOT 0.2 0.1* 0.2   ALKPHOS 115 125 121   AST '29 24 19   ' ALT 76* 76* 67*   PROT 6.9 6.9 7.0   LABALBU 3.2* 3.1* 3.2*   GLOB 3.7 3.8 3.8      Cardiac  Lab Results   Component Value Date/Time    TROPHS 4.7 01/26/2022 06:07 AM    TROPHS 3.4 01/26/2022 06:07 AM    TROPHS <3.0 01/26/2022 01:44 AM      Coags No results found for: PROTIME, INR, APTT   A1c Lab Results   Component Value Date/Time    LABA1C 5.4 01/27/2022 06:51 AM    EAG 108 01/27/2022 06:51 AM  Lipids No results  found for: CHOL, LDLCALC, LABVLDL, HDL, CHOLHDLRATIO, TRIG   Thyroid  No results found for: TSHELE, TSH3GEN     Most Recent UA Lab Results   Component Value Date/Time    COLORU YELLOW 05/25/2020 08:02 AM    SPECGRAV 1.016 05/25/2020 08:02 AM    PROTEINU Negative 05/25/2020 08:02 AM    GLUCOSEU Negative 05/25/2020 08:02 AM    KETUA Negative 05/25/2020 08:02 AM    BILIRUBINUR Negative 05/25/2020 08:02 AM    BLOODU TRACE 05/25/2020 08:02 AM    NITRU Negative 05/25/2020 08:02 AM    LEUKOCYTESUR LARGE 05/25/2020 08:02 AM    WBCUA >100 05/25/2020 08:02 AM    RBCUA 0 05/25/2020 08:02 AM    BACTERIA 2+ 05/25/2020 08:02 AM    MUCUS 0 05/25/2020 08:02 AM        Microbiology:  Results       ** No results found for the last 336 hours. **            All Labs from Last 24 Hrs:  Recent Results (from the past 24 hour(s))   POCT Glucose    Collection Time: 01/28/22 11:07 AM   Result Value Ref Range    POC Glucose 346 (H) 65 - 100 mg/dL    Performed by: BridgesEmilyPCT    POCT Glucose    Collection Time: 01/28/22  4:11 PM   Result Value Ref Range    POC Glucose 262 (H) 65 - 100 mg/dL    Performed by: BridgesEmilyPCT    POCT Glucose    Collection Time: 01/28/22  9:11 PM   Result Value Ref Range    POC Glucose 399 (H) 65 - 100 mg/dL    Performed by: SinclairPCTMelissa    CBC with Auto Differential    Collection Time: 01/29/22  5:20 AM   Result Value Ref Range    WBC 15.2 (H) 4.3 - 11.1 K/uL    RBC 4.20 4.05 - 5.2 M/uL    Hemoglobin 13.0 11.7 - 15.4 g/dL    Hematocrit 39.0 35.8 - 46.3 %    MCV 92.9 82.0 - 102.0 FL    MCH 31.0 26.1 - 32.9 PG    MCHC 33.3 31.4 - 35.0 g/dL    RDW 12.1 11.9 - 14.6 %    Platelets 323 150 - 450 K/uL    MPV 10.4 9.4 - 12.3 FL    nRBC 0.00 0.0 - 0.2 K/uL    Differential Type AUTOMATED      Neutrophils % 63 43 - 78 %    Lymphocytes % 26 13 - 44 %    Monocytes % 8 4.0 - 12.0 %    Eosinophils % 1 0.5 - 7.8 %    Basophils % 0 0.0 - 2.0 %    Immature Granulocytes 1 0.0 - 5.0 %    Neutrophils Absolute 9.6 (H) 1.7 -  8.2 K/UL    Lymphocytes Absolute 4.0 0.5 - 4.6 K/UL    Monocytes Absolute 1.3 0.1 - 1.3 K/UL    Eosinophils Absolute 0.1 0.0 - 0.8 K/UL    Basophils Absolute 0.0 0.0 - 0.2 K/UL    Absolute Immature Granulocyte 0.2 0.0 - 0.5 K/UL   Comprehensive Metabolic Panel w/ Reflex to MG    Collection Time: 01/29/22  5:20 AM   Result Value Ref Range    Sodium 137 133 - 143 mmol/L    Potassium 3.6 3.5 - 5.1 mmol/L    Chloride 105  101 - 110 mmol/L    CO2 27 21 - 32 mmol/L    Anion Gap 5 2 - 11 mmol/L    Glucose 195 (H) 65 - 100 mg/dL    BUN 16 6 - 23 MG/DL    Creatinine 0.84 0.6 - 1.0 MG/DL    Est, Glom Filt Rate >60 >60 ml/min/1.31m    Calcium 8.6 8.3 - 10.4 MG/DL    Total Bilirubin 0.2 0.2 - 1.1 MG/DL    ALT 67 (H) 12 - 65 U/L    AST 19 15 - 37 U/L    Alk Phosphatase 121 50 - 136 U/L    Total Protein 7.0 6.3 - 8.2 g/dL    Albumin 3.2 (L) 3.5 - 5.0 g/dL    Globulin 3.8 2.8 - 4.5 g/dL    Albumin/Globulin Ratio 0.8 0.4 - 1.6     Phosphorus    Collection Time: 01/29/22  5:20 AM   Result Value Ref Range    Phosphorus 3.6 2.5 - 4.5 MG/DL   POCT Glucose    Collection Time: 01/29/22  7:09 AM   Result Value Ref Range    POC Glucose 141 (H) 65 - 100 mg/dL    Performed by: DeSalvoJuliePCT        Allergies   Allergen Reactions    Fish-Derived Products     Peanut Oil Other (See Comments)    Montelukast Rash     States not allergic to this med     Immunization History   Administered Date(s) Administered    DTaP vaccine 02/15/1999, 04/21/1999, 06/22/1999, 12/18/2000, 11/12/2003    Hepatitis B vaccine 02000-08-07 06/22/1999, 09/16/1999    Hib vaccine 02/15/1999, 04/21/1999, 06/22/1999, 05/03/2000    Influenza Virus Vaccine 08/10/2006, 02/28/2008    MMR, PRIORIX, M-M-R II, (age 1669m, SC, 0.59m459m1/14/2001, 11/12/2003    Pneumococcal, PCV-13, PREVNAR 13,53age 6w+), IM, 0.59mL63m/28/2000, 04/21/1999, 06/22/1999, 05/02/2000    Poliovirus, IPOL, (age 6w+), SC/IM, 0.59mL 60m28/2000, 04/21/1999, 09/16/1999, 11/12/2003    Varicella, VARIVAX, (age  33m+)7m, 0.59mL 1156m/2001, 01/08/2009       Recent Vital Data:  Patient Vitals for the past 24 hrs:   Temp Pulse Resp BP SpO2   01/29/22 0808 -- -- -- -- 97 %   01/29/22 0708 98.4 F (36.9 C) 67 18 121/80 94 %   01/28/22 2103 -- 100 -- -- --   01/28/22 2041 -- 87 -- -- 97 %   01/28/22 1924 97.7 F (36.5 C) 82 18 135/68 97 %       Oxygen Therapy  SpO2: 97 %  Pulse via Oximetry: 79 beats per minute  Pulse Oximeter Device Mode: Continuous  Pulse Oximeter Device Location: Right, Finger  O2 Device: None (Room air)  Skin Assessment: Clean, dry, & intact  Skin Protection for O2 Device: No  O2 Flow Rate (L/min): 2 L/min    Estimated body mass index is 34.97 kg/m as calculated from the following:    Height as of this encounter: '5\' 8"'  (1.727 m).    Weight as of this encounter: 230 lb (104.3 kg).  No intake or output data in the 24 hours ending 01/29/22 1021      Physical Exam:    General:    Well nourished.  No overt distress  Head:  Normocephalic, atraumatic  Eyes:  Sclerae appear normal.  Pupils equally round.    HENT:  Nares appear normal, no drainage.  Moist mucous membranes  Neck:  No restricted ROM.  Trachea midline  CV:   RRR.  No m/r/g.  No JVD  Lungs:   CTAB.  No wheezing, rhonchi, or rales.  Respirations even, unlabored  Abdomen:   Soft, nontender, nondistended.    Extremities: Warm and dry.  No cyanosis or clubbing.  No edema.    Skin:     No rashes.  Normal coloration  Neuro:  CN II-XII grossly intact.  Psych:  Normal mood and affect.    Signed:  Humberto Leep, MD    Part of this note may have been written by using a voice dictation software.  The note has been proof read but may still contain some grammatical/other typographical errors.

## 2022-01-29 NOTE — Discharge Summary (Signed)
Hospitalist Discharge Summary   Admit Date:  01/25/2022  7:25 AM   DC Note date: 01/29/2022  Name:  Ann Medina   Age:  21 y.o.  Sex:  female  DOB:  [birth year]   MRN:  [id number]   Room:  340/01  PCP:  On File Not (Inactive)    Presenting Complaint: Shortness of Breath     Initial Admission Diagnosis: Shortness of breath [R06.02]  Acute asthma exacerbation [J45.901]  Acute exacerbation of extrinsic asthma [J45.901]     Problem List for this Hospitalization (present on admission):    Principal Problem:    Acute asthma exacerbation  Active Problems:    GERD (gastroesophageal reflux disease)    Allergic rhinitis    Shortness of breath    Non-cardiac chest pain  Resolved Problems:    * No resolved hospital problems. *      Hospital Course:     Ann Medina is a 21 y.o. female with medical history of asthma, allergies, GERD who is evaluated with shortness of breath/ wheezing. Had increased shortness of breath/wheezing. She has been followed by [facility] but insurance lapsed. Ran out of her meds. Gets short of breath walking and speaking. She was treated with IV steroids and inhalers. Pulmonary consulted. She was on [facility] and weaned off oxygen. She is saturating well on RA. Her wheezing improved. Transitioned to PO steroids. Her blood glucose was high due to steroids and was given insulin. Pt is hemodynamically stable for discharge.     Disposition: Home  Diet: ADULT DIET; Regular  Code Status: Full Code    Follow Ups:   Follow-up Information     [facility]. Schedule an appointment as soon as possible for a   visit in 1 week(s).    Contact information:  [place]  [phone]                 Time spent in patient discharge and coordination 39 minutes.        Follow up labs/diagnostics (ultimately defer to outpatient provider):  Follow up with PCP in 3-5 days   Follow up with Pulmonology in 2 weeks at [facility] was discussed with patient.  All  questions answered.  Patient was stable at time of discharge.  Instructions given to call a physician or return if any concerns.    Current Discharge Medication List        START taking these medications    Details   predniSONE (DELTASONE) 20 MG tablet Take 2 tablets by mouth daily for 2 doses  Qty: 4 tablet, Refills: 0      pantoprazole (PROTONIX) 40 MG tablet Take 1 tablet by mouth every morning (before breakfast)  Qty: 30 tablet, Refills: 0           CONTINUE these medications which have CHANGED    Details   albuterol sulfate HFA (PROVENTIL HFA) 108 (90 Base) MCG/ACT inhaler Inhale 2 puffs into the lungs every 6 hours as needed for Wheezing  Qty: 18 g, Refills: 2      budesonide-formoterol (SYMBICORT) 80-4.5 MCG/ACT AERO Inhale 2 puffs into the lungs daily  Qty: 10.2 g, Refills: 0      fluticasone-salmeterol (ADVAIR DISKUS) 250-50 MCG/ACT AEPB diskus inhaler Inhale 1 puff into the lungs in the morning and 1 puff in the evening.  Qty: 60 each, Refills: 3      montelukast (  SINGULAIR) 10 MG tablet Take 1 tablet by mouth daily  Qty: 30 tablet, Refills: 3      tiotropium (SPIRIVA) 18 MCG inhalation capsule Inhale 1 capsule into the lungs daily  Qty: 30 capsule, Refills: 3           CONTINUE these medications which have NOT CHANGED    Details   cetirizine (ZYRTEC) 10 MG tablet Take 1 tablet by mouth daily      budesonide (PULMICORT) 0.5 MG/2ML nebulizer suspension Inhale 500 mcg into the lungs      fluticasone (FLONASE) 50 MCG/ACT nasal spray 2 sprays by Nasal route      levalbuterol (XOPENEX) 0.31 MG/3ML nebulization Inhale 0.31 mg into the lungs every 4 hours as needed             Some medications may have been reported old/obsolete and marked "stop taking" by the system; in reality pt was already off these meds; defer to outpatient or prescribing providers.    Procedures done this admission:  * No surgery found *    Consults this admission:  IP CONSULT TO PULMONOLOGY  IP CONSULT TO CARDIOLOGY  IP CONSULT TO DIABETES  MANAGEMENT    Echocardiogram results:  01/25/22    TRANSTHORACIC ECHOCARDIOGRAM (TTE) COMPLETE (CONTRAST/BUBBLE/3D PRN) 01/26/2022 10:41 AM (Final)    Interpretation Summary    Left Ventricle: Normal left ventricular systolic function. EF by 2D Simpsons Biplane is 63%. Left ventricle size is normal. Mildly increased wall thickness. Normal wall motion. Normal diastolic function. Average E/e' ratio is 7.42.    Right Ventricle: Right ventricle size is normal. Normal systolic function.    Mitral Valve: Mild regurgitation.    Tricuspid Valve: Trace regurgitation.    Signed by: Walker Kehr, DO on 01/26/2022 10:41 AM      Diagnostic Imaging/Tests:   XR CHEST PORTABLE    Result Date: 01/25/2022  No acute process      CT CHEST PULMONARY EMBOLISM W CONTRAST    Result Date: 01/26/2022  1. No acute cardiopulmonary abnormality. Specifically, no acute pulmonary emboli.        Labs: Results:       BMP, Mg, Phos Recent Labs     01/27/22  0651 01/28/22  0534 01/29/22  0520   NA 138 138 137   K 5.0 4.2 3.6   CL 107 107 105   CO2 '25 26 27   ' ANIONGAP '6 5 5   ' BUN '14 19 16   ' CREATININE 0.96 0.97 0.84   LABGLOM >60 >60 >60   CALCIUM 9.0 8.8 8.6   GLUCOSE 285* 298* 195*   PHOS 3.8 3.3 3.6      CBC '@LABRCNT' (WBC:3,RBC:3,HGB:3,HCT:3,MCV:3,MCH:3,MCHC:3,RDW:3,PLT:3,MPV:3,NRBC:3,SEGS:3,LYMPHOPCT:3,EOSRELPCT:3,MONOPCT:3,BASOPCT:3,IMMGRAN:3,SEGSABS:3,LYMPHSABS:3,EOSABS:3,MONOSABS:3,BASOSABS:3,ABSIMMGRAN:3)@   LFT Recent Labs     01/27/22  0651 01/28/22  0534 01/29/22  0520   BILITOT 0.2 0.1* 0.2   ALKPHOS 115 125 121   AST '29 24 19   ' ALT 76* 76* 67*   PROT 6.9 6.9 7.0   LABALBU 3.2* 3.1* 3.2*   GLOB 3.7 3.8 3.8      Cardiac  Lab Results   Component Value Date/Time    TROPHS 4.7 01/26/2022 06:07 AM    TROPHS 3.4 01/26/2022 06:07 AM    TROPHS <3.0 01/26/2022 01:44 AM      Coags No results found for: PROTIME, INR, APTT   A1c Lab Results   Component Value Date/Time    LABA1C 5.4 01/27/2022 06:51 AM    EAG 108 01/27/2022 06:51 AM  Lipids No results  found for: CHOL, LDLCALC, LABVLDL, HDL, CHOLHDLRATIO, TRIG   Thyroid  No results found for: TSHELE, TSH3GEN     Most Recent UA Lab Results   Component Value Date/Time    COLORU YELLOW 05/25/2020 08:02 AM    SPECGRAV 1.016 05/25/2020 08:02 AM    PROTEINU Negative 05/25/2020 08:02 AM    GLUCOSEU Negative 05/25/2020 08:02 AM    KETUA Negative 05/25/2020 08:02 AM    BILIRUBINUR Negative 05/25/2020 08:02 AM    BLOODU TRACE 05/25/2020 08:02 AM    NITRU Negative 05/25/2020 08:02 AM    LEUKOCYTESUR LARGE 05/25/2020 08:02 AM    WBCUA >100 05/25/2020 08:02 AM    RBCUA 0 05/25/2020 08:02 AM    BACTERIA 2+ 05/25/2020 08:02 AM    MUCUS 0 05/25/2020 08:02 AM        Microbiology:  Results       ** No results found for the last 336 hours. **            All Labs from Last 24 Hrs:  Recent Results (from the past 24 hour(s))   POCT Glucose    Collection Time: 01/28/22 11:07 AM   Result Value Ref Range    POC Glucose 346 (H) 65 - 100 mg/dL    Performed by: BridgesEmilyPCT    POCT Glucose    Collection Time: 01/28/22  4:11 PM   Result Value Ref Range    POC Glucose 262 (H) 65 - 100 mg/dL    Performed by: BridgesEmilyPCT    POCT Glucose    Collection Time: 01/28/22  9:11 PM   Result Value Ref Range    POC Glucose 399 (H) 65 - 100 mg/dL    Performed by: SinclairPCTMelissa    CBC with Auto Differential    Collection Time: 01/29/22  5:20 AM   Result Value Ref Range    WBC 15.2 (H) 4.3 - 11.1 K/uL    RBC 4.20 4.05 - 5.2 M/uL    Hemoglobin 13.0 11.7 - 15.4 g/dL    Hematocrit 39.0 35.8 - 46.3 %    MCV 92.9 82.0 - 102.0 FL    MCH 31.0 26.1 - 32.9 PG    MCHC 33.3 31.4 - 35.0 g/dL    RDW 12.1 11.9 - 14.6 %    Platelets 323 150 - 450 K/uL    MPV 10.4 9.4 - 12.3 FL    nRBC 0.00 0.0 - 0.2 K/uL    Differential Type AUTOMATED      Neutrophils % 63 43 - 78 %    Lymphocytes % 26 13 - 44 %    Monocytes % 8 4.0 - 12.0 %    Eosinophils % 1 0.5 - 7.8 %    Basophils % 0 0.0 - 2.0 %    Immature Granulocytes 1 0.0 - 5.0 %    Neutrophils Absolute 9.6 (H) 1.7 -  8.2 K/UL    Lymphocytes Absolute 4.0 0.5 - 4.6 K/UL    Monocytes Absolute 1.3 0.1 - 1.3 K/UL    Eosinophils Absolute 0.1 0.0 - 0.8 K/UL    Basophils Absolute 0.0 0.0 - 0.2 K/UL    Absolute Immature Granulocyte 0.2 0.0 - 0.5 K/UL   Comprehensive Metabolic Panel w/ Reflex to MG    Collection Time: 01/29/22  5:20 AM   Result Value Ref Range    Sodium 137 133 - 143 mmol/L    Potassium 3.6 3.5 - 5.1 mmol/L    Chloride 105  101 - 110 mmol/L    CO2 27 21 - 32 mmol/L    Anion Gap 5 2 - 11 mmol/L    Glucose 195 (H) 65 - 100 mg/dL    BUN 16 6 - 23 MG/DL    Creatinine 0.84 0.6 - 1.0 MG/DL    Est, Glom Filt Rate >60 >60 ml/min/1.84m    Calcium 8.6 8.3 - 10.4 MG/DL    Total Bilirubin 0.2 0.2 - 1.1 MG/DL    ALT 67 (H) 12 - 65 U/L    AST 19 15 - 37 U/L    Alk Phosphatase 121 50 - 136 U/L    Total Protein 7.0 6.3 - 8.2 g/dL    Albumin 3.2 (L) 3.5 - 5.0 g/dL    Globulin 3.8 2.8 - 4.5 g/dL    Albumin/Globulin Ratio 0.8 0.4 - 1.6     Phosphorus    Collection Time: 01/29/22  5:20 AM   Result Value Ref Range    Phosphorus 3.6 2.5 - 4.5 MG/DL   POCT Glucose    Collection Time: 01/29/22  7:09 AM   Result Value Ref Range    POC Glucose 141 (H) 65 - 100 mg/dL    Performed by: DeSalvoJuliePCT        Allergies   Allergen Reactions    Fish-Derived Products     Peanut Oil Other (See Comments)    Montelukast Rash     States not allergic to this med     Immunization History   Administered Date(s) Administered    DTaP vaccine 02/15/1999, 04/21/1999, 06/22/1999, 12/18/2000, 11/12/2003    Hepatitis B vaccine 006-16-00 06/22/1999, 09/16/1999    Hib vaccine 02/15/1999, 04/21/1999, 06/22/1999, 05/03/2000    Influenza Virus Vaccine 08/10/2006, 02/28/2008    MMR, PRIORIX, M-M-R II, (age 6138m, SC, 0.37m33m1/14/2001, 11/12/2003    Pneumococcal, PCV-13, PREVNAR 13,76age 6w+), IM, 0.37mL82m/28/2000, 04/21/1999, 06/22/1999, 05/02/2000    Poliovirus, IPOL, (age 6w+), SC/IM, 0.37mL 58m28/2000, 04/21/1999, 09/16/1999, 11/12/2003    Varicella, VARIVAX, (age  10m+)16m, 0.37mL 119m/2001, 01/08/2009       Recent Vital Data:  Patient Vitals for the past 24 hrs:   Temp Pulse Resp BP SpO2   01/29/22 0808 -- -- -- -- 97 %   01/29/22 0708 98.4 F (36.9 C) 67 18 121/80 94 %   01/28/22 2103 -- 100 -- -- --   01/28/22 2041 -- 87 -- -- 97 %   01/28/22 1924 97.7 F (36.5 C) 82 18 135/68 97 %       Oxygen Therapy  SpO2: 97 %  Pulse via Oximetry: 79 beats per minute  Pulse Oximeter Device Mode: Continuous  Pulse Oximeter Device Location: Right, Finger  O2 Device: None (Room air)  Skin Assessment: Clean, dry, & intact  Skin Protection for O2 Device: No  O2 Flow Rate (L/min): 2 L/min    Estimated body mass index is 34.97 kg/m as calculated from the following:    Height as of this encounter: '5\' 8"'  (1.727 m).    Weight as of this encounter: 230 lb (104.3 kg).  No intake or output data in the 24 hours ending 01/29/22 1021      Physical Exam:    General:    Well nourished.  No overt distress  Head:  Normocephalic, atraumatic  Eyes:  Sclerae appear normal.  Pupils equally round.    HENT:  Nares appear normal, no drainage.  Moist mucous membranes  Neck:  No restricted ROM.  Trachea midline  CV:   RRR.  No m/r/g.  No JVD  Lungs:   CTAB.  No wheezing, rhonchi, or rales.  Respirations even, unlabored  Abdomen:   Soft, nontender, nondistended.    Extremities: Warm and dry.  No cyanosis or clubbing.  No edema.    Skin:     No rashes.  Normal coloration  Neuro:  CN II-XII grossly intact.  Psych:  Normal mood and affect.    Signed:  Humberto Leep, MD    Part of this note may have been written by using a voice dictation software.  The note has been proof read but may still contain some grammatical/other typographical errors.

## 2022-01-29 NOTE — Plan of Care (Signed)
Problem: Respiratory - Adult  Goal: Achieves optimal ventilation and oxygenation  Outcome: Progressing  Flowsheets (Taken 01/27/2022 2009 by Cecile Hearing, RCP)  Achieves optimal ventilation and oxygenation:   Assess for changes in respiratory status   Position to facilitate oxygenation and minimize respiratory effort   Initiate smoking cessation protocol as indicated   Assess the need for suctioning and aspirate as needed   Respiratory therapy support as indicated   Assess for changes in mentation and behavior   Oxygen supplementation based on oxygen saturation or arterial blood gases   Encourage broncho-pulmonary hygiene including cough, deep breathe, incentive spirometry   Assess and instruct to report shortness of breath or any respiratory difficulty

## 2022-03-15 LAB — HEMOGLOBIN A1C
Estimated Avg Glucose, External: 120 mg/dL
Hemoglobin A1C, External: 5.8 % — ABNORMAL HIGH (ref <5.7)

## 2022-07-13 LAB — HEMOGLOBIN A1C
Estimated Avg Glucose, External: 120 mg/dL
Hemoglobin A1C, External: 5.8 % — ABNORMAL HIGH (ref 4.8–5.6)

## 2022-11-08 LAB — HEMOGLOBIN A1C
Estimated Avg Glucose, External: 128 mg/dL
Hemoglobin A1C, External: 6.1 % — ABNORMAL HIGH (ref <5.7)

## 2023-09-21 ENCOUNTER — Encounter: Payer: PRIVATE HEALTH INSURANCE | Attending: Family | Primary: Sports Medicine

## 2023-09-25 ENCOUNTER — Emergency Department: Admit: 2023-09-25 | Primary: Sports Medicine

## 2023-09-25 ENCOUNTER — Inpatient Hospital Stay
Admit: 2023-09-25 | Discharge: 2023-09-26 | Disposition: A | Discharge status: Home / Self Care | Payer: BLUE CROSS/BLUE SHIELD | Arrived: VH

## 2023-09-25 DIAGNOSIS — J45901 Unspecified asthma with (acute) exacerbation: Secondary | ICD-10-CM

## 2023-09-25 MED ORDER — IOPAMIDOL 76 % IV SOLN
76 | Freq: Once | INTRAVENOUS | Status: AC | PRN
Start: 2023-09-25 — End: 2023-09-25
  Administered 2023-09-26: 01:00:00 100 mL via INTRAVENOUS

## 2023-09-25 MED ORDER — IPRATROPIUM-ALBUTEROL 0.5-2.5 (3) MG/3ML IN SOLN
0.5-2.5 | RESPIRATORY_TRACT | Status: AC
Start: 2023-09-25 — End: 2023-09-25
  Administered 2023-09-26: 01:00:00 1 via RESPIRATORY_TRACT

## 2023-09-25 NOTE — ED Provider Notes (Signed)
 Emergency Department Provider Note       PCP: Not, On File (Inactive)   Age: 23 y.o.   Sex: female     DISPOSITION Discharge - Pending Orders Complete 09/25/2023 10:34:05 PM    ICD-10-CM    1. Moderate asthma with exacerbation, unspecified whether persistent  J45.901           Medical Decision Making     In summary, Ann Medina is a 23 y.o. female who presented to the emergency department today with shortness of.  She does present with dyspnea with wheezing most likely secondary to asthma exacerbation.  She did note that the chest pain and shortness of breath does seem likely worse and the chest pain was new from other asthma exacerbations that she has had in the past.  At this time not really consistent with any ACS pathology.  But given new chest pain we did do a troponin which was under 6.  At this time felt reasonable to not do a second.  X-ray and CT scan were obtained as patient did state that the chest pain was new and the shortness of breath was seemingly worse and may be slightly different.  This was unremarkable for any CHF, effusion or tamponade.  No PE or pneumothorax or COPD noted.  There is no pneumonia noted.  2 DuoNebs were ordered and patient was wheezing improved symptomatically.  She felt better after both of these.  We did give her a dose of steroid as she feels like that usually helps long-term.  We did recommend she follow-up with her primary care doctor tomorrow or to Wednesday if needed  Ann Medina is currently non toxic, well appearing and protecting own airway without difficulty. Therefore, it is in my clinical judgement that her presentation is most consistent with asthma exacerbation. She was discharged home with not only discharge instructions but also strict return precautions. She was instructed to have a low threshold for return to the ED for re-evaluation and to follow up with PMD for further follow up if needed.     ED Course as of 09/25/23 2255   Mon Sep 25, 2023   2025  EKG and associated rhythm strip independent interpretation:   Normal sinus rhythm  No evidence of acute ST ischemic changes  No evidence of deviated axis  No evidence of QTc prolongation, 439  Rate of 95   [CL]   2204 Recheck.  After second DuoNeb patient's breathing considerably improved.  She does feel better.  Wheezing also improved. [CL]      ED Course User Index  [CL] Cathren Laine, PA-C     1 acute, uncomplicated illness or injury.  Patient was discharged risks and benefits of hospitalization were considered.  Shared medical decision making was utilized in creating the patients health plan today.  I independently ordered and reviewed each unique test.    I reviewed external records: provider visit note from outside specialist.  I reviewed external records: previous EKG including cardiologist interpretation.         I interpreted the CT Scan no.  ED provider's independent EKG interpretation as above            History     The chief complaints are a asthma exacerbation and chest pain that started yesterday. No pertinent past medical history. The patient reports experiencing asthma exacerbation and new chest pain that began yesterday. They have used a respirator and nebulizer but are still having trouble  walking, even short distances, such as to the restroom without having increased shortness of breath and chest pain. The patient began coughing today and mentioned that similar symptoms have occurred before, but the chest pain is new and different from past experiences and the shortness of breath feels worse than prior asthma exacerbations. They have previously been hospitalized for asthma and usually receive DuoNeb treatments during those times. The patient denied any prior hospital visits for other reasons.       The history is provided by the patient. No language interpreter was used.       ROS     Review of Systems     Physical Exam     Vitals signs and nursing note reviewed:  Vitals:    09/25/23 1909  09/25/23 2058   BP: (!) 134/90    Pulse: (!) 102 89   Resp: 22 16   SpO2: 98% 100%   Weight: 129.3 kg (285 lb)    Height: 1.727 m (5\' 8" )       Physical Exam  Constitutional:       General: She is not in acute distress.     Appearance: Normal appearance. She is not ill-appearing.   HENT:      Head: Normocephalic and atraumatic.   Eyes:      Conjunctiva/sclera: Conjunctivae normal.   Cardiovascular:      Rate and Rhythm: Normal rate and regular rhythm.      Heart sounds: Normal heart sounds.   Pulmonary:      Effort: Pulmonary effort is normal. No accessory muscle usage or respiratory distress.      Breath sounds: Decreased air movement present. Wheezing present. No decreased breath sounds, rhonchi or rales.   Musculoskeletal:         General: Normal range of motion.   Neurological:      Mental Status: She is alert.   Psychiatric:         Mood and Affect: Mood normal.         Behavior: Behavior normal.        Procedures     Procedures    Orders placed during this emergency department visit:     Orders Placed This Encounter   Procedures    XR CHEST (2 VW)    CT CHEST PULMONARY EMBOLISM W CONTRAST    Basic Metabolic Panel    CBC with Auto Differential    Troponin    Magnesium    Cardiac Monitor - ED Only    Continuous Pulse Oximetry    Pulse oximetry, continuous    EKG 12 Lead    Saline lock IV        Medications given during this emergency department visit:     Medications   ipratropium 0.5 mg-albuterol 2.5 mg (DUONEB) nebulizer solution 1 Dose (1 Dose Inhalation Given 09/25/23 2058)   iopamidol (ISOVUE-370) 76 % injection 100 mL (100 mLs IntraVENous Given 09/25/23 2121)   ipratropium 0.5 mg-albuterol 2.5 mg (DUONEB) nebulizer solution 1 Dose (1 Dose Inhalation Given 09/25/23 2200)   dexAMETHasone (DECADRON) injection 10 mg (10 mg IntraMUSCular Given 09/25/23 2248)       New prescriptions:     New Prescriptions    No medications on file        Past History and Complexity:     Past Medical History:   Diagnosis Date    Allergic  rhinitis     Aspergillosis (HCC)     Asthma  Asthma     GERD (gastroesophageal reflux disease)     Molluscum contagiosum         No past surgical history on file.     Social History     Socioeconomic History    Marital status: Single   Tobacco Use    Smoking status: Never   Substance and Sexual Activity    Alcohol use: No    Drug use: No     Social Drivers of Psychologist, prison and probation services Strain: Low Risk  (08/30/2023)    Received from [facility]    Financial Resource Strain     Difficulty Paying Living Expenses: Not hard at all     Difficulty Paying Medical Expenses: No   Food Insecurity: No Food Insecurity (08/30/2023)    Received from [facility]    Food Insecurity     Worried about Running Out of Food in the Last Year: Never true     Ran Out of Food in the Last Year: Never true   Transportation Needs: No Transportation Needs (08/30/2023)    Received from [facility]    Transportation Needs     Lack of Transportation: No   Physical Activity: Insufficiently Active (02/20/2023)    Received from [facility]    Physical Activity     Days of Exercise per Week: 2 days     On average, how many minutes do you exercise per day?: 30     Total Minutes of Exercise Per Week: 60   Stress: No Stress Concern Present (08/30/2023)    Received from [facility]    Stress     Feeling of Stress : Not at all   Social Connections: Socially Integrated (08/30/2023)    Received from [facility]    Social Connections     Frequency of Communication with Friends and Family: More than three times a week     Frequency of Social Gatherings with Friends and Family: More than three times a week   Intimate Partner Violence: Not At Risk (08/30/2023)    Received from [facility]    Intimate Partner Violence     Fear of Current or Ex-Partner: No     Emotionally Abused: No     Physically Abused: No     Sexually Abused: No   Housing Stability: Not At Risk (08/30/2023)    Received from [facility] Stability     Was there a time when  you did not have a steady place to sleep: No     Worried that the place you are staying is making you sick: No        Previous Medications    ALBUTEROL SULFATE HFA (PROVENTIL HFA) 108 (90 BASE) MCG/ACT INHALER    Inhale 2 puffs into the lungs every 6 hours as needed for Wheezing    AMITRIPTYLINE (ELAVIL) 10 MG TABLET    TAKE 1-2 TABLETS BY MOUTH AT BEDTIME AS NEEDED FOR SLEEP. MDD: 20 MG    BUDESONIDE (PULMICORT) 0.5 MG/2ML NEBULIZER SUSPENSION    Take 2 mLs by nebulization 2 times daily    BUDESONIDE-FORMOTEROL (SYMBICORT) 80-4.5 MCG/ACT AERO    Inhale 2 puffs into the lungs daily    FLUTICASONE (FLONASE) 50 MCG/ACT NASAL SPRAY    2 sprays by Nasal route    FLUTICASONE-SALMETEROL (ADVAIR DISKUS) 250-50 MCG/ACT AEPB DISKUS INHALER    Inhale 1 puff into the lungs in the morning and 1 puff in  the evening.    LEVALBUTEROL (XOPENEX) 0.31 MG/3ML NEBULIZATION    Take 3 mLs by nebulization every 4 hours as needed for Shortness of Breath or Wheezing    MONTELUKAST (SINGULAIR) 10 MG TABLET    Take 1 tablet by mouth daily    PANTOPRAZOLE (PROTONIX) 40 MG TABLET    Take 1 tablet by mouth every morning (before breakfast)    TIOTROPIUM (SPIRIVA) 18 MCG INHALATION CAPSULE    Inhale 1 capsule into the lungs daily        Results from this emergency department visit:      Results for orders placed or performed during the hospital encounter of 09/25/23   XR CHEST (2 VW)    Narrative    Chest X-ray    INDICATION: wheezing, SOB    COMPARISON: January 25, 2022    TECHNIQUE: PA and lateral views of the chest were obtained.    FINDINGS: The lungs are clear. There are no infiltrates or effusions.  The heart  size is normal.  The bony thorax is intact.        Impression    No acute findings in the chest      Electronically signed by Sande Brothers   CT CHEST PULMONARY EMBOLISM W CONTRAST    Narrative    EXAMINATION: CT CHEST PULMONARY EMBOLISM W CONTRAST 09/25/2023 9:28 PM    ACCESSION NUMBER: [id number]    COMPARISON: None  available    INDICATION: sob, cp    TECHNIQUE: Multiple contiguous 2D axial CT images of the chest were obtained  from the lung apices to the lung bases after the intravenous administration of  Iso-vue 370 per pulmonary angiography protocol.  Coronal reconstructions  were performed.    Radiation dose reduction techniques were used for this study. Our CT scanners  use one or all of the following: Automated exposure control, adjustment of the  mA and/or kV according to patient size, iterative reconstruction.    FINDINGS:  STUDY QUALITY: The exam study quality is good.    AIRWAYS: The central airways are patent.    LUNGS: The lungs are clear. No nodule or consolidation.    PLEURA: No pleural effusion or pneumothorax.     HEART: Normal heart and pericardium. No coronary artery calcification.    THORACIC AORTA: Normal thoracic aorta without aneurysm or dissection.    PULMONARY ARTERY: Pulmonary artery is normal enhance. No thromboembolic disease    MEDIASTINUM/HILA: No mediastinal mass or lymphadenopathy.    CHEST WALL: No mass or axillary lymphadenopathy. Hepatomegaly.    UPPER ABDOMEN: The visualized upper abdomen is unremarkable.     BONES: Intraosseous hemangiomas of T4 and T12, benign and of no clinical  significance.        Impression    No acute findings. No pulmonary embolus or thoracic aortic pathology. Clear  lungs. Incidental hepatomegaly.      Electronically signed by Elsie Stain   Basic Metabolic Panel   Result Value Ref Range    Sodium 141 136 - 145 mmol/L    Potassium 3.9 3.5 - 5.1 mmol/L    Chloride 105 98 - 107 mmol/L    CO2 24 20 - 29 mmol/L    Anion Gap 12 7 - 16 mmol/L    Glucose 118 (H) 70 - 99 mg/dL    BUN 7 6 - 23 MG/DL    Creatinine 8.29 5.62 - 1.10 MG/DL    Est, Glom Filt Rate >90 >60 ml/min/1.81m2  Calcium 9.1 8.8 - 10.2 MG/DL   CBC with Auto Differential   Result Value Ref Range    WBC 11.2 (H) 4.3 - 11.1 K/uL    RBC 4.01 (L) 4.05 - 5.2 M/uL    Hemoglobin 11.7 11.7 - 15.4 g/dL     Hematocrit 60.4 54.0 - 46.3 %    MCV 91.5 82.0 - 102.0 FL    MCH 29.2 26.1 - 32.9 PG    MCHC 31.9 31.4 - 35.0 g/dL    RDW 98.1 19.1 - 47.8 %    Platelets 354 150 - 450 K/uL    MPV 10.1 9.4 - 12.3 FL    nRBC 0.00 0.0 - 0.2 K/uL    Differential Type AUTOMATED      Neutrophils % 62.9 43.0 - 78.0 %    Lymphocytes % 24.3 13.0 - 44.0 %    Monocytes % 6.5 4.0 - 12.0 %    Eosinophils % 5.2 0.5 - 7.8 %    Basophils % 0.8 0.0 - 2.0 %    Immature Granulocytes % 0.3 0.0 - 5.0 %    Neutrophils Absolute 7.08 1.70 - 8.20 K/UL    Lymphocytes Absolute 2.73 0.50 - 4.60 K/UL    Monocytes Absolute 0.73 0.10 - 1.30 K/UL    Eosinophils Absolute 0.58 0.00 - 0.80 K/UL    Basophils Absolute 0.09 0.00 - 0.20 K/UL    Immature Granulocytes Absolute 0.03 0.0 - 0.5 K/UL   Troponin   Result Value Ref Range    Troponin T <6.0 0 - 14 ng/L   Magnesium   Result Value Ref Range    Magnesium 1.9 1.8 - 2.4 mg/dL         CT CHEST PULMONARY EMBOLISM W CONTRAST   Final Result      No acute findings. No pulmonary embolus or thoracic aortic pathology. Clear   lungs. Incidental hepatomegaly.         Electronically signed by Elsie Stain      XR CHEST (2 VW)   Final Result   No acute findings in the chest         Electronically signed by Sande Brothers                   No results for input(s): "COVID19" in the last 72 hours.     Voice dictation software was used during the making of this note.  This software is not perfect and grammatical and other typographical errors may be present.  This note has not been completely proofread for errors.     Cathren Laine, PA-C  09/25/23 2255

## 2023-09-25 NOTE — ED Triage Notes (Signed)
 Pt presents to the ER for SOB and chest pain. Pt states s/s began yesterday. Pt staets chest pain feels like a "tightness." Pt states the pain is mostly on the R side, but also present on the L side.    Pt has hx of asthma, and had tried to use rescue inhaler at home, and went on to use neb every four hours to no relief.

## 2023-09-25 NOTE — Discharge Instructions (Signed)
 Thank you for choosing to trust [place] here at [facility] ED with your health today. It was my pleasure to care for you. Please continue to take care of yourself and we hope you recover quickly. If you get any communication regarding a survey about your experience in the ED today, I encourage to you to fill this out. We are always striving and looking for ways to improve our practice and expertise so that we can continue to help our patients to the best of our ability. Please see below for some basic discharge instructions along with things to watch out for, which should prompt a return to the Emergency Department for re-evaluation:     General Return Precautions: Overall today, I did not find any life-threatening causes for your symptoms. However, this does not mean that something serious could not develop. If you experience any new or worsening symptoms, it is important that you come back for further evaluation. Not returning for re-evaluation could lead to severe complications, permanent impairment, and/or death. If you are experiencing symptoms of a heart attack, which include but are not limited to chest pain, pressure, that radiates to the neck, arms, back, shortness of breath especially with normal exertion, burping or heartburn please call 911 and return for evaluation. Always be aware of possible stroke symptoms which can be easily remembered by BE FAST Balance (trouble standing/walking), Eyes (vision changes), Face (one sided facial drooping), Arm (weakness/numbness on one side), Speech (speech alterations), Time (Sooner the better).   Please return if you have new or worse symptoms, shortness of breath, trouble breathing or inability to catch her breath.  Please also return for any other concerning symptoms to you.

## 2023-09-25 NOTE — ED Notes (Signed)
 Patient mobility status  with no difficulty.     I have reviewed discharge instructions with the patient.  The patient verbalized understanding.    Patient left ED via Discharge Method: ambulatory to Home with friend.    Opportunity for questions and clarification provided.     Patient given 0 scripts.

## 2023-09-25 NOTE — ED Triage Notes (Signed)
 Pt c/o asthma flare-up since yesterday with no relief from home inhaler.  Wheezing with labored breathing in triage.

## 2023-09-26 LAB — BASIC METABOLIC PANEL
Anion Gap: 12 mmol/L (ref 7–16)
BUN: 7 mg/dL (ref 6–23)
CO2: 24 mmol/L (ref 20–29)
Calcium: 9.1 mg/dL (ref 8.8–10.2)
Chloride: 105 mmol/L (ref 98–107)
Creatinine: 0.89 mg/dL (ref 0.60–1.10)
Est, Glom Filt Rate: >90 mL/min/{1.73_m2} (ref >60)
Glucose: 118 mg/dL — ABNORMAL HIGH (ref 70–99)
Potassium: 3.9 mmol/L (ref 3.5–5.1)
Sodium: 141 mmol/L (ref 136–145)

## 2023-09-26 LAB — CBC WITH AUTO DIFFERENTIAL
Basophils %: 0.8 % (ref 0.0–2.0)
Basophils Absolute: 0.09 10*3/uL (ref 0.00–0.20)
Eosinophils %: 5.2 % (ref 0.5–7.8)
Eosinophils Absolute: 0.58 10*3/uL (ref 0.00–0.80)
Hematocrit: 36.7 % (ref 35.8–46.3)
Hemoglobin: 11.7 g/dL (ref 11.7–15.4)
Immature Granulocytes %: 0.3 % (ref 0.0–5.0)
Immature Granulocytes Absolute: 0.03 10*3/uL (ref 0.0–0.5)
Lymphocytes %: 24.3 % (ref 13.0–44.0)
Lymphocytes Absolute: 2.73 10*3/uL (ref 0.50–4.60)
MCH: 29.2 pg (ref 26.1–32.9)
MCHC: 31.9 g/dL (ref 31.4–35.0)
MCV: 91.5 FL (ref 82.0–102.0)
MPV: 10.1 FL (ref 9.4–12.3)
Monocytes %: 6.5 % (ref 4.0–12.0)
Monocytes Absolute: 0.73 10*3/uL (ref 0.10–1.30)
Neutrophils %: 62.9 % (ref 43.0–78.0)
Neutrophils Absolute: 7.08 10*3/uL (ref 1.70–8.20)
Platelets: 354 10*3/uL (ref 150–450)
RBC: 4.01 M/uL — ABNORMAL LOW (ref 4.05–5.2)
RDW: 13.2 % (ref 11.9–14.6)
WBC: 11.2 10*3/uL — ABNORMAL HIGH (ref 4.3–11.1)
nRBC: 0 10*3/uL (ref 0.0–0.2)

## 2023-09-26 LAB — TROPONIN: Troponin T: <6 ng/L (ref 0–14)

## 2023-09-26 LAB — MAGNESIUM: Magnesium: 1.9 mg/dL (ref 1.8–2.4)

## 2023-09-26 MED ORDER — DEXAMETHASONE SODIUM PHOSPHATE 10 MG/ML IJ SOLN
10 | INTRAMUSCULAR | Status: AC
Start: 2023-09-26 — End: 2023-09-25
  Administered 2023-09-26: 03:00:00 10 mg via INTRAMUSCULAR

## 2023-09-26 MED ORDER — IPRATROPIUM-ALBUTEROL 0.5-2.5 (3) MG/3ML IN SOLN
0.5-2.5 | RESPIRATORY_TRACT | Status: AC
Start: 2023-09-26 — End: 2023-09-25
  Administered 2023-09-26: 02:00:00 1 via RESPIRATORY_TRACT

## 2023-09-26 MED FILL — DEXAMETHASONE SODIUM PHOSPHATE 10 MG/ML IJ SOLN: 10 MG/ML | INTRAMUSCULAR | Qty: 1

## 2023-09-26 MED FILL — IPRATROPIUM-ALBUTEROL 0.5-2.5 (3) MG/3ML IN SOLN: 0.5-2.5 (3) MG/3ML | RESPIRATORY_TRACT | Qty: 3

## 2023-09-29 LAB — EKG 12-LEAD
Atrial Rate: 95 {beats}/min
Diagnosis: NORMAL
P Axis: 74 degrees
P-R Interval: 144 ms
Q-T Interval: 350 ms
QRS Duration: 84 ms
QTc Calculation (Bazett): 439 ms
R Axis: 31 degrees
T Axis: 30 degrees
Ventricular Rate: 95 {beats}/min

## 2023-11-10 ENCOUNTER — Ambulatory Visit: Admit: 2023-11-10 | Discharge: 2023-11-10 | Payer: BLUE CROSS/BLUE SHIELD | Primary: Sports Medicine

## 2023-11-10 VITALS — Resp 18

## 2023-11-10 DIAGNOSIS — J4541 Moderate persistent asthma with (acute) exacerbation: Secondary | ICD-10-CM

## 2023-11-10 NOTE — Progress Notes (Signed)
 PROGRESS NOTE    SUBJECTIVE:   [facility].  She is a 23 y.o. female seen for asthma.  Patient is experiencing increased shortness of breath and states she does not have rescue inhaler or nebulizer with her.  Patient states that symptoms started this morning and has increasingly worsened.        Asthma  She complains of chest tightness, cough, difficulty breathing, frequent throat clearing, shortness of breath and wheezing. This is a chronic problem. The current episode started today. The problem has been gradually worsening. The cough is non-productive. Associated symptoms include chest pain. Her symptoms are aggravated by change in weather and pollen. Her past medical history is significant for asthma.       Current Outpatient Medications   Medication Sig Dispense Refill    amitriptyline (ELAVIL) 10 MG tablet TAKE 1-2 TABLETS BY MOUTH AT BEDTIME AS NEEDED FOR SLEEP. MDD: 20 MG      albuterol  sulfate HFA (PROVENTIL  HFA) 108 (90 Base) MCG/ACT inhaler Inhale 2 puffs into the lungs every 6 hours as needed for Wheezing 18 g 2    budesonide -formoterol  (SYMBICORT ) 80-4.5 MCG/ACT AERO Inhale 2 puffs into the lungs daily 10.2 g 0    fluticasone -salmeterol (ADVAIR  DISKUS) 250-50 MCG/ACT AEPB diskus inhaler Inhale 1 puff into the lungs in the morning and 1 puff in the evening. 60 each 3    tiotropium (SPIRIVA ) 18 MCG inhalation capsule Inhale 1 capsule into the lungs daily 30 capsule 3    pantoprazole  (PROTONIX ) 40 MG tablet Take 1 tablet by mouth every morning (before breakfast) 30 tablet 0    budesonide  (PULMICORT ) 0.5 MG/2ML nebulizer suspension Take 2 mLs by nebulization 2 times daily 60 each 3    levalbuterol  (XOPENEX ) 0.31 MG/3ML nebulization Take 3 mLs by nebulization every 4 hours as needed for Shortness of Breath or Wheezing 1 mL EACH    montelukast  (SINGULAIR ) 10 MG tablet Take 1 tablet by mouth daily 30 tablet 0    fluticasone   (FLONASE) 50 MCG/ACT nasal spray 2 sprays by Nasal route (Patient not taking: Reported on 01/25/2022)       No current facility-administered medications for this visit.     Allergies   Allergen Reactions    Apple Angioedema    Carrot Angioedema    Fish-Derived Products Anaphylaxis and Other (See Comments)    Peanut (Diagnostic) Anaphylaxis and Hives    Shellfish Allergy Hives    Peanut Oil Other (See Comments)    Montelukast  Rash     States not allergic to this med     Social History     Tobacco Use    Smoking status: Never    Smokeless tobacco: Not on file   Substance Use Topics    Alcohol use: No       Review of Systems   Respiratory:  Positive for cough, shortness of breath and wheezing.    Cardiovascular:  Positive for chest pain.          OBJECTIVE:  Resp 18   SpO2 98%      Physical Exam  Constitutional:       Appearance: She is obese. She is ill-appearing. She is not toxic-appearing or diaphoretic.   HENT:      Head: Normocephalic.   Cardiovascular:      Rate and Rhythm: Normal rate.      Pulses: Normal pulses.   Pulmonary:  Breath sounds: Decreased air movement present. Examination of the right-upper field reveals decreased breath sounds and wheezing. Examination of the left-upper field reveals decreased breath sounds and wheezing. Examination of the right-middle field reveals decreased breath sounds. Examination of the left-middle field reveals decreased breath sounds. Examination of the right-lower field reveals decreased breath sounds and wheezing. Examination of the left-lower field reveals decreased breath sounds and wheezing. Decreased breath sounds and wheezing present.   Chest:      Chest wall: Tenderness present.   Skin:     General: Skin is warm and dry.   Neurological:      General: No focal deficit present.      Mental Status: She is alert.   Psychiatric:         Attention and Perception: Attention normal.         Mood and Affect: Mood and affect normal.         Speech: Speech normal.          Behavior: Behavior normal. Behavior is cooperative.         Thought Content: Thought content normal.         ASSESSMENT and PLAN    Maly was seen today for asthma.    Diagnoses and all orders for this visit:    Moderate persistent asthma with acute exacerbation    EMS called to clinic at 0824, Ems arrive to clinic at 0837.  Patient was given nebulizer treatment by EMS and transferred via gurney to [facility] ER at 0845.  Patients supervisor was notified.       Counseled on benefits of having a primary care provider which includes, but is not limited to, continuity of care and having a medical home when concerns arise. Also enforced that onsite clinic policy states that we are not to take the place of a primary care provider, pt verbalized understanding.     SEs and risk vs benefits associated with medications prescribed discussed with patient who verbalized understanding. Pt verbalized understanding and agreement with plan of care. RTC for persisting/worsening symptoms or new complaints that arise. Discussed signs and symptoms that would warrant immediate evaluation including, but not limited to HA, blurred vision, speech disturbance, difficulty with ambulation/gait, numbness, tingling, weakness, syncope, chest pain, or shortness of breath.    I have reviewed the patient's medication list, past medical, family, social, and surgical history in detail and updated the patient record appropriately.  No follow-up provider specified.    Rozena Cornish, APRN - NP

## 2023-12-12 ENCOUNTER — Ambulatory Visit: Admit: 2023-12-12 | Discharge: 2023-12-12 | Payer: BLUE CROSS/BLUE SHIELD | Attending: Family | Primary: Sports Medicine

## 2023-12-12 VITALS — BP 120/80 | HR 93 | Temp 98.40000°F | Resp 22

## 2023-12-12 DIAGNOSIS — J4541 Moderate persistent asthma with (acute) exacerbation: Secondary | ICD-10-CM

## 2023-12-12 MED ORDER — IPRATROPIUM-ALBUTEROL 0.5-2.5 (3) MG/3ML IN SOLN
0.5-2.5 | RESPIRATORY_TRACT | 0 refills | 15.00000 days | Status: AC | PRN
Start: 2023-12-12 — End: ?

## 2023-12-12 NOTE — Progress Notes (Signed)
 PROGRESS NOTE    SUBJECTIVE:   Ann Medina is a 23 y.o. female seen in the [facility] located at [facility] for chest tightness with pain, shortness of breath, and wheezing that started yesterday. Report it improves some with her nebulizer treatments but that she believes she needs duoneb  instead of the plain albuterol  solution. Has a history of asthma with frequent exacerbations with last ER visit in May.     Chief Complaint    Asthma           History provided by:  Patient  Language interpreter used: No    Asthma  She complains of chest tightness, difficulty breathing, shortness of breath and wheezing. There is no cough, frequent throat clearing, hemoptysis, hoarse voice or sputum production. This is a recurrent problem. The current episode started yesterday. The problem occurs constantly. The problem has been waxing and waning. Associated symptoms include chest pain and dyspnea on exertion. Pertinent negatives include no ear pain, fever, headaches, malaise/fatigue, myalgias, nasal congestion, postnasal drip, rhinorrhea, sneezing or sore throat. Her symptoms are aggravated by nothing. Her symptoms are alleviated by beta-agonist, oral steroids and steroid inhaler. She reports minimal improvement on treatment. Her past medical history is significant for asthma.       Current Outpatient Medications   Medication Sig Dispense Refill    ipratropium 0.5 mg-albuterol  2.5 mg (DUONEB ) 0.5-2.5 (3) MG/3ML SOLN nebulizer solution Inhale 3 mLs into the lungs every 4-6 hours as needed for Shortness of Breath or Wheezing 360 mL 0    dupilumab (DUPIXENT) 300 MG/2ML SOAJ injection Inject 2 mLs into the skin every 14 days      WIXELA INHUB  500-50 MCG/ACT AEPB diskus inhaler INHALE 1 PUFF 2 TIMES A DAY RINSE MOUTH AFTER USE.      amitriptyline (ELAVIL) 10 MG tablet TAKE 1-2 TABLETS BY MOUTH AT BEDTIME AS NEEDED FOR SLEEP. MDD: 20 MG      albuterol  sulfate HFA (PROVENTIL  HFA) 108 (90 Base)  MCG/ACT inhaler Inhale 2 puffs into the lungs every 6 hours as needed for Wheezing 18 g 2    levalbuterol  (XOPENEX ) 0.31 MG/3ML nebulization Take 3 mLs by nebulization every 4 hours as needed for Shortness of Breath or Wheezing 1 mL EACH    montelukast  (SINGULAIR ) 10 MG tablet Take 1 tablet by mouth daily 30 tablet 0     No current facility-administered medications for this visit.      Allergies   Allergen Reactions    Apple Angioedema    Carrot Angioedema    Fish-Derived Products Anaphylaxis and Other (See Comments)    Peanut (Diagnostic) Anaphylaxis and Hives    Shellfish Allergy Hives    Celery Oil Itching     Itching in throat    Peanut Oil Other (See Comments)    Montelukast  Rash     States not allergic to this med       Social History     Tobacco Use    Smoking status: Never   Substance Use Topics    Alcohol use: No    Drug use: No        Review of Systems   Constitutional:  Negative for chills, fatigue, fever and malaise/fatigue.   HENT:  Negative for congestion, ear pain, hoarse voice, postnasal drip, rhinorrhea, sinus pressure, sinus pain, sneezing and sore throat.    Eyes:  Negative for pain, discharge, redness and itching.   Respiratory:  Positive for chest tightness, shortness of  breath and wheezing. Negative for cough, hemoptysis and sputum production.    Cardiovascular:  Positive for chest pain and dyspnea on exertion.   Gastrointestinal:  Negative for abdominal pain, diarrhea, nausea and vomiting.   Musculoskeletal:  Negative for arthralgias and myalgias.   Allergic/Immunologic: Positive for environmental allergies and food allergies.   Neurological:  Negative for dizziness, light-headedness and headaches.          OBJECTIVE:  BP 120/80 (BP Site: Left Upper Arm, Patient Position: Sitting, BP Cuff Size: Large Adult)   Pulse 93   Temp 98.4 F (36.9 C) (Oral)   Resp 22   SpO2 97%      No results found for this visit on 12/12/23.    Physical Exam  Vitals reviewed.   Constitutional:       General: She  is awake. She is not in acute distress.     Appearance: Normal appearance. She is well-developed and well-groomed.   Eyes:      Pupils: Pupils are equal, round, and reactive to light. Pupils are equal.      Right eye: Pupil is not sluggish.      Left eye: Pupil is not sluggish.   Cardiovascular:      Rate and Rhythm: Normal rate and regular rhythm.      Heart sounds: Normal heart sounds.   Pulmonary:      Effort: Pulmonary effort is normal.      Breath sounds: Decreased breath sounds and wheezing present.   Neurological:      Mental Status: She is alert and oriented to person, place, and time.      Motor: No weakness.      Coordination: Coordination is intact. Coordination normal.      Gait: Gait is intact. Gait normal.   Psychiatric:         Behavior: Behavior is cooperative.         ASSESSMENT and PLAN    Ann Medina was seen today for asthma.    Diagnoses and all orders for this visit:    Moderate persistent asthma with acute exacerbation  Comments:  Work excuse given to go get her duonebs and a previously prescribed sourse of prednisone  to start taking ASAP. Present to ER or urgent care if persists/worsens  Orders:  -     ipratropium 0.5 mg-albuterol  2.5 mg (DUONEB ) 0.5-2.5 (3) MG/3ML SOLN nebulizer solution; Inhale 3 mLs into the lungs every 4-6 hours as needed for Shortness of Breath or Wheezing        Counseled on benefits of having a primary care provider which includes, but is not limited to, continuity of care and having a medical home when concerns arise. Also enforced that onsite clinic policy states that we are not to take the place of a primary care provider, pt verbalized understanding.     SEs and risk vs benefits associated with medications prescribed discussed with patient who verbalized understanding. Pt verbalized understanding and agreement with plan of care. RTC for persisting/worsening symptoms or new complaints that arise. Discussed signs and symptoms that would warrant immediate evaluation  including, but not limited to HA, blurred vision, speech disturbance, difficulty with ambulation/gait, numbness, tingling, weakness, syncope, chest pain, or shortness of breath.    I have reviewed the patient's medication list, past medical, family, social, and surgical history in detail and updated the patient record appropriately.    Ann DELENA Bounds, APRN - CNP

## 2024-02-11 ENCOUNTER — Inpatient Hospital Stay
Admit: 2024-02-11 | Discharge: 2024-02-11 | Disposition: A | Discharge status: Home / Self Care | Payer: BLUE CROSS/BLUE SHIELD | Arrived: VH

## 2024-02-11 DIAGNOSIS — M25561 Pain in right knee: Principal | ICD-10-CM

## 2024-02-11 MED ORDER — NAPROXEN 500 MG PO TABS
500 | ORAL_TABLET | Freq: Two times a day (BID) | ORAL | 0 refills | 30.00000 days | Status: DC | PRN
Start: 2024-02-11 — End: 2024-02-29

## 2024-02-11 MED ORDER — DICLOFENAC SODIUM 1 % EX GEL
1 | Freq: Four times a day (QID) | CUTANEOUS | 0 refills | Status: AC | PRN
Start: 2024-02-11 — End: 2024-02-21

## 2024-02-11 MED ORDER — LIDOCAINE 4 % EX PTCH
4 | Freq: Every day | CUTANEOUS | 0 refills | Status: AC
Start: 2024-02-11 — End: 2024-02-21

## 2024-02-11 NOTE — ED Triage Notes (Signed)
 Pt c/o R knee pain since last weekend, states she thinks she may have injured it dancing, states pain has progressively worsened over the past week.

## 2024-02-11 NOTE — ED Provider Notes (Signed)
 Emergency Department Provider Note       [facility] EMERGENCY DEPT   PCP: Not, On File (Inactive)   Age: 23 y.o.   Sex: female     DISPOSITION Decision To Discharge 02/11/2024 06:25:18 PM    ICD-10-CM    1. Acute pain of right knee  M25.561           Medical Decision Making     Ann Medina is a 23 y.o. female presents with knee pain. .  Working list of differentials includes, but is not limited to fracture, dislocation, subluxation, ligament versus tendon injury, etc.     On physical exam she had no frank deformity.  No erythema.  No ecchymosis.  She had no frank skin changes.  She did have some slight medial tenderness with lateral pressure.  But no frank instability.  No need for x-ray based on mechanism of injury.  Given knee immobilizer.  Discharged home.    Ann Medina appeared clinically stable.  In my clinical judgment, the working diagnosis of knee pain is appropriate and supported by today's evaluation.  She was discharged in stable condition.      Return precautions were reviewed verbally and in the discharge paperwork. She was counseled on the diagnosis, treatment, course, and red flag symptoms. Risks/benefits were discussed in plain language, all questions answered, and Ann Medina verbalized understanding/agreement with the plan.       1 acute, uncomplicated illness or injury.  Patient was discharged risks and benefits of hospitalization were considered.  Shared medical decision making was utilized in creating the patients health plan today.  I independently ordered and reviewed each unique test.                         History     Ann Medina, a 23 year old female, presents with right knee pain following a line dancing session. She reports that the pain and swelling began on the same day she was dancing and have progressively worsened. She describes her attempts at self-treatment, including the use of Bio-Freeze, Ibuprofen , icing, and warm compresses, with no significant relief. She denies any  pain in the thigh, hip, or ankle. She expresses a need for pain relief medication to be sent to her pharmacy. Ann Medina denies any known allergies.    The history is provided by the patient. No language interpreter was used.       ROS     Review of Systems     Physical Exam     Vitals signs and nursing note reviewed:  Vitals:    02/11/24 1724   BP: 118/86   Pulse: (!) 115   Resp: 20   Temp: 98.8 F (37.1 C)   TempSrc: Oral   SpO2: 99%      Physical Exam  Constitutional:       General: She is not in acute distress.     Appearance: Normal appearance. She is not ill-appearing.   HENT:      Head: Normocephalic and atraumatic.   Eyes:      Conjunctiva/sclera: Conjunctivae normal.   Pulmonary:      Effort: Pulmonary effort is normal. No respiratory distress.   Musculoskeletal:         General: Normal range of motion.      Comments: On physical exam patient had no frank deformity.  She had no erythema.  No Bakers cyst palpable.  She had no calf pain or tenderness.  She had pain only on the extreme of extension and both passive and active motion.  She did have some medial tenderness on the lateral palpation and pressure.   Neurological:      Mental Status: She is alert.   Psychiatric:         Mood and Affect: Mood normal.         Behavior: Behavior normal.        Procedures     Procedures    Orders placed during this emergency department visit:     Orders Placed This Encounter   Procedures    [employer] Knee Immobilizer, Right (O8169); 14        Medications given during this emergency department visit:   Medications - No data to display    New prescriptions:     Discharge Medication List as of 02/11/2024  6:21 PM        START taking these medications    Details   diclofenac  sodium (VOLTAREN ) 1 % GEL Apply 4 g topically 4 times daily as needed for Pain (spasm, muscle soreness), Topical, 4 TIMES DAILY PRN Starting Sun 02/11/2024, Until Wed 02/21/2024 at 2359, For 10 days, Disp-100 g, R-0, Normal       lidocaine  4 % external patch Place 1 patch onto the skin daily for 10 days, TransDERmal, DAILY Starting Sun 02/11/2024, Until Wed 02/21/2024, For 10 days, Disp-10 each, R-0, Normal      naproxen  (NAPROSYN ) 500 MG tablet Take 1 tablet by mouth 2 times daily as needed for Pain, Disp-14 tablet, R-0Normal              Past History and Complexity:     Past Medical History:   Diagnosis Date    Allergic rhinitis     Aspergillosis (HCC)     Asthma     Asthma     GERD (gastroesophageal reflux disease)     Molluscum contagiosum         History reviewed. No pertinent surgical history.     Social History     Socioeconomic History    Marital status: Single     Spouse name: None    Number of children: None    Years of education: None    Highest education level: None   Tobacco Use    Smoking status: Never   Substance and Sexual Activity    Alcohol use: No    Drug use: No     Social Drivers of Psychologist, prison and probation services Strain: Low Risk  (08/30/2023)    Received from [facility]    Financial Resource Strain     Difficulty Paying Living Expenses: Not hard at all     Difficulty Paying Medical Expenses: No   Food Insecurity: No Food Insecurity (08/30/2023)    Received from [facility]    Food Insecurity     Worried about Running Out of Food in the Last Year: Never true     Ran Out of Food in the Last Year: Never true   Transportation Needs: No Transportation Needs (08/30/2023)    Received from [facility]    Transportation Needs     Lack of Transportation: No   Physical Activity: Insufficiently Active (02/20/2023)    Received from [facility]    Physical Activity     Days of Exercise per Week: 2 days     On average, how many minutes do you exercise per day?: 30  Total Minutes of Exercise Per Week: 60   Stress: No Stress Concern Present (08/30/2023)    Received from [facility]    Stress     Feeling of Stress : Not at all   Social Connections: Socially Integrated (08/30/2023)    Received from [facility]    Social Connections      Frequency of Communication with Friends and Family: More than three times a week     Frequency of Social Gatherings with Friends and Family: More than three times a week   Intimate Partner Violence: Not At Risk (08/30/2023)    Received from [facility]    Intimate Partner Violence     Fear of Current or Ex-Partner: No     Emotionally Abused: No     Physically Abused: No     Sexually Abused: No   Housing Stability: Not At Risk (08/30/2023)    Received from [facility] Stability     Was there a time when you did not have a steady place to sleep: No     Worried that the place you are staying is making you sick: No        Discharge Medication List as of 02/11/2024  6:21 PM        CONTINUE these medications which have NOT CHANGED    Details   ipratropium 0.5 mg-albuterol  2.5 mg (DUONEB ) 0.5-2.5 (3) MG/3ML SOLN nebulizer solution Inhale 3 mLs into the lungs every 4-6 hours as needed for Shortness of Breath or Wheezing, Disp-360 mL, R-0Normal      dupilumab (DUPIXENT) 300 MG/2ML SOAJ injection Inject 2 mLs into the skin every 14 daysHistorical Med      WIXELA INHUB  500-50 MCG/ACT AEPB diskus inhaler INHALE 1 PUFF 2 TIMES A DAY RINSE MOUTH AFTER USE., DAWHistorical Med      amitriptyline (ELAVIL) 10 MG tablet TAKE 1-2 TABLETS BY MOUTH AT BEDTIME AS NEEDED FOR SLEEP. MDD: [facility]      albuterol  sulfate HFA (PROVENTIL  HFA) 108 (90 Base) MCG/ACT inhaler Inhale 2 puffs into the lungs every 6 hours as needed for Wheezing, Disp-18 g, R-2Print      levalbuterol  (XOPENEX ) 0.31 MG/3ML nebulization Take 3 mLs by nebulization every 4 hours as needed for Shortness of Breath or Wheezing, Disp-1 mL, R-EACHPrint      montelukast  (SINGULAIR ) 10 MG tablet Take 1 tablet by mouth daily, Disp-30 tablet, R-0Normal              Results from this emergency department visit:      No results found for any visits on 02/11/24.      No orders to display                No results for input(s): COVID19 in the last 72  hours.     Voice dictation software was used during the making of this note.  This software is not perfect and grammatical and other typographical errors may be present.  This note has not been completely proofread for errors.     Corky Ole Rase, PA-C  02/11/24 2202

## 2024-02-11 NOTE — Discharge Instructions (Addendum)
[]   Thank you for coming in to see us  at the [facility] Emergency Department today. It was a pleasure to care for you, and we truly hope you're feeling better soon.    []  General Overview:   Overall plan physical exam you do have any pain that is in the medial or the inside of your knee.  There is no frank deformity or any obvious fracture.  We decided that we did not need to do an x-ray based on her mechanism of injury.  He did agree.  We have referred you to orthopedic for further evaluation and treatment.  We have also given you a knee brace.  Please wean to comfort.    []  KNEE PAIN/TRAUMA    Pain in the knee may result from injury to ligaments, tendons, muscles, or cartilage; X-rays today showed no fracture, dislocation, or subluxation.    Home Instructions: Rest the knee and avoid squatting, kneeling, or running. Elevate the leg on pillows to reduce swelling and pain. Take ibuprofen  or acetaminophen  as directed. Apply ice or heat as needed--ice 10-15 minutes at a time, 4-6 times daily.    Return Precautions: Return for worsening or uncontrolled pain, progressive weakness, new numbness, loss of bladder or bowel control, fevers, chills, body aches, or any concerning new symptoms.      []  NSAIDs  You were given an anti-inflammatory medication (NSAID) called, naproxen , today to help reduce pain and inflammation.  Home Instructions: Take only the prescribed NSAID; do not take multiple NSAIDs at the same time. Avoid combining with other over-the-counter NSAIDs (e.g., ibuprofen , naproxen ). Take with food to reduce stomach upset. Speak with your provider before switching or adding any NSAIDs  Return Precautions:Return for severe abdominal pain, black or bloody stools, unusual bleeding, or any other concerning symptoms.      []  PRIMARY CARE PHYSICIAN:   Establishing care with a primary doctor is important for your long-term health and follow-up after today's visit.  Home Instructions: Call [phone] between 7 AM - 6 PM,  Monday to Friday. A care navigator will help you schedule with a nearby primary care provider. Be sure to bring your discharge paperwork to your first appointment  Return Precautions: Return to the ED if new or concerning symptoms arise before you're able to follow up with a primary care provider.    []  Right now, we didn't find an immediate life-threatening diagnosis causing your current symptoms, which is great news. That said, it's still possible for something more serious to develop later on. If your symptoms get worse or anything new comes up, please don't wait--come back in and let us  take another look. Ignoring symptoms could lead to serious complications, long-term issues, or in rare cases, even life-threatening situations.    []  When to Get Help Right Away  Included in all discharge paperwork are signs and symptoms of a heart attack and/or stroke.  Please see attached paperwork to review these signs and please return to the emergency department right away if any of these occur.    []  One Last Thing  If you get a survey about your visit today, we'd really appreciate it if you could take a few minutes to fill it out. Your feedback helps us  keep improving and doing our best for every patient who walks through our doors.    Take good care of yourself--and don't hesitate to come back if you need us .

## 2024-02-29 ENCOUNTER — Ambulatory Visit: Admit: 2024-02-29 | Discharge: 2024-02-29 | Payer: BLUE CROSS/BLUE SHIELD | Attending: Family | Primary: Sports Medicine

## 2024-02-29 LAB — AMB POC SARS-COV-2 AND INFLUENZA A/B
Influenza A Antigen, POC: NEGATIVE
Influenza B Antigen, POC: NEGATIVE
Lot/Kit Number: 4328851
SARS-COV-2 RNA, POC: NEGATIVE

## 2024-02-29 MED ORDER — ONDANSETRON 4 MG PO TBDP
4 | ORAL_TABLET | Freq: Three times a day (TID) | ORAL | 0 refills | Status: AC | PRN
Start: 2024-02-29 — End: ?

## 2024-02-29 NOTE — Progress Notes (Signed)
 PROGRESS NOTE    SUBJECTIVE:   Ann Medina is a 23 y.o. female seen in the [facility] located at [facility] for nausea, chills, and abdominal pain that started today. Denies any known or suspected sick contacts.      Chief Complaint    Nausea; Abdominal Pain; Chills           Abdominal Pain  This is a new problem. The current episode started today. The onset quality is sudden. The problem has been unchanged. The pain is located in the generalized abdominal region. The pain is mild. The quality of the pain is aching and colicky. The abdominal pain does not radiate. Associated symptoms include diarrhea and nausea. Pertinent negatives include no arthralgias, fever, frequency, headaches, myalgias or vomiting. She has tried nothing for the symptoms.         Current Outpatient Medications   Medication Sig Dispense Refill    Semaglutide,0.25 or 0.5MG /DOS, (OZEMPIC, 0.25 OR 0.5 MG/DOSE,) 2 MG/3ML SOPN Inject 0.25 mg into the skin every 7 days      albuterol  (PROVENTIL ) (2.5 MG/3ML) 0.083% nebulizer solution USE 1 VIAL VIA NEBUILIZER EVERY 4 HOURS AS NEEDED FOR SHORTNESS OF BREATH      ondansetron  (ZOFRAN -ODT) 4 MG disintegrating tablet Take 1 tablet by mouth 3 times daily as needed for Nausea or Vomiting 30 tablet 0    diclofenac  sodium (VOLTAREN ) 1 % GEL Apply 4 g topically 4 times daily as needed for Pain (spasm, muscle soreness) 100 g 0    ipratropium 0.5 mg-albuterol  2.5 mg (DUONEB ) 0.5-2.5 (3) MG/3ML SOLN nebulizer solution Inhale 3 mLs into the lungs every 4-6 hours as needed for Shortness of Breath or Wheezing 360 mL 0    dupilumab (DUPIXENT) 300 MG/2ML SOAJ injection Inject 2 mLs into the skin every 14 days      WIXELA INHUB  500-50 MCG/ACT AEPB diskus inhaler INHALE 1 PUFF 2 TIMES A DAY RINSE MOUTH AFTER USE.      amitriptyline (ELAVIL) 10 MG tablet TAKE 1-2 TABLETS BY MOUTH AT BEDTIME AS NEEDED FOR SLEEP. MDD: 20 MG      albuterol  sulfate HFA (PROVENTIL  HFA) 108 (90 Base)  MCG/ACT inhaler Inhale 2 puffs into the lungs every 6 hours as needed for Wheezing 18 g 2    montelukast  (SINGULAIR ) 10 MG tablet Take 1 tablet by mouth daily 30 tablet 0     No current facility-administered medications for this visit.      Allergies   Allergen Reactions    Apple Angioedema    Carrot Angioedema    Fish-Derived Products Anaphylaxis and Other (See Comments)    Peanut (Diagnostic) Anaphylaxis and Hives    Shellfish Allergy Hives    Celery Oil Itching     Itching in throat    Peanut Oil Other (See Comments)    Montelukast  Rash     States not allergic to this med       Social History     Tobacco Use    Smoking status: Never   Substance Use Topics    Alcohol use: No    Drug use: No        Review of Systems   Constitutional:  Positive for chills. Negative for fatigue and fever.   HENT:  Positive for postnasal drip. Negative for congestion, ear pain, rhinorrhea, sinus pressure, sinus pain, sneezing and sore throat.    Eyes:  Negative for pain, discharge, redness and itching.   Respiratory:  Negative for shortness of breath.    Cardiovascular:  Negative for chest pain.   Gastrointestinal:  Positive for abdominal pain, diarrhea and nausea. Negative for vomiting.   Genitourinary:  Negative for frequency.   Musculoskeletal:  Negative for arthralgias and myalgias.   Neurological:  Negative for dizziness, light-headedness and headaches.          OBJECTIVE:  BP 120/82 (BP Site: Left Upper Arm, Patient Position: Sitting, BP Cuff Size: Large Adult)   Pulse 94   Temp 97.9 F (36.6 C) (Oral)   Resp 16   SpO2 99%      Results for orders placed or performed in visit on 02/29/24   AMB POC SARS-COV-2 AND INFLUENZA A/B   Result Value Ref Range    Internal Control yes     Lot/Kit Number [id number]     Lot/Kit expire date: 08/22/24     SARS-COV-2 RNA, POC Negative     Influenza A Antigen, POC Negative Not Detected    Influenza B Antigen, POC Negative Not Detected    Vendor and kit name [employer]          Physical Exam  Vitals  reviewed.   Constitutional:       General: She is awake. She is not in acute distress.     Appearance: Normal appearance. She is well-developed and well-groomed.   HENT:      Head: Normocephalic.      Right Ear: Hearing, ear canal and external ear normal. No drainage, swelling or tenderness. A middle ear effusion is present.      Left Ear: Hearing, ear canal and external ear normal. No drainage, swelling or tenderness. A middle ear effusion is present.      Nose: Mucosal edema and rhinorrhea present. Rhinorrhea is clear.      Right Turbinates: Swollen and pale.      Left Turbinates: Swollen and pale.      Right Sinus: No maxillary sinus tenderness or frontal sinus tenderness.      Left Sinus: No maxillary sinus tenderness or frontal sinus tenderness.      Mouth/Throat:      Mouth: Mucous membranes are moist.      Pharynx: Oropharynx is clear. Uvula midline. No pharyngeal swelling or posterior oropharyngeal erythema.      Tonsils: No tonsillar exudate.      Comments: PND with cobblestoning  Eyes:      General: Lids are normal. No allergic shiner.     Conjunctiva/sclera: Conjunctivae normal.      Right eye: Right conjunctiva is not injected. No chemosis.     Left eye: Left conjunctiva is not injected. No chemosis.  Neck:      Thyroid: No thyromegaly.      Trachea: Trachea normal.   Cardiovascular:      Rate and Rhythm: Normal rate and regular rhythm.      Heart sounds: Normal heart sounds.   Pulmonary:      Effort: Pulmonary effort is normal.      Breath sounds: Normal breath sounds.   Abdominal:      Tenderness: There is no abdominal tenderness.   Musculoskeletal:      Cervical back: Normal range of motion and neck supple.   Lymphadenopathy:      Head:      Right side of head: No submental, submandibular, tonsillar, preauricular, posterior auricular or occipital adenopathy.      Left side of head: No submental, submandibular, tonsillar, preauricular, posterior auricular or  occipital adenopathy.   Skin:     General:  Skin is warm and dry.   Neurological:      Mental Status: She is alert and oriented to person, place, and time.   Psychiatric:         Behavior: Behavior is cooperative.         ASSESSMENT and PLAN    Jaylissa was seen today for nausea, abdominal pain and chills.    Diagnoses and all orders for this visit:    Nausea and vomiting, unspecified vomiting type  Comments:  COVID/flu negative at this time. Zofran  as prescribed to help alleviate nausea, maintain hydration, and with slow advancement of diet once symptoms improve  Orders:  -     AMB POC SARS-COV-2 AND INFLUENZA A/B  -     ondansetron  (ZOFRAN -ODT) 4 MG disintegrating tablet; Take 1 tablet by mouth 3 times daily as needed for Nausea or Vomiting    Diarrhea, unspecified type  Comments:  Discussed that OTC Imodium AD should only be used if symptoms are severe and interfering with ADLs as it can prolong the illness, pt verbalized understanding. Push PO fluids and stick to a diet of bland, soft foods until symptoms resolve     Counseled on benefits of having a primary care provider which includes, but is not limited to, continuity of care and having a medical home when concerns arise. Also enforced that onsite clinic policy states that we are not to take the place of a primary care provider, pt verbalized understanding.     SEs and risk vs benefits associated with medications prescribed discussed with patient who verbalized understanding. Pt verbalized understanding and agreement with plan of care. RTC for persisting/worsening symptoms or new complaints that arise. Discussed signs and symptoms that would warrant immediate evaluation including, but not limited to HA, blurred vision, speech disturbance, difficulty with ambulation/gait, numbness, tingling, weakness, syncope, chest pain, or shortness of breath.    I have reviewed the patient's medication list, past medical, family, social, and surgical history in detail and updated the patient record  appropriately.    Darleene DELENA Bounds, APRN - CNP
